# Patient Record
Sex: Male | Born: 1990 | Race: Black or African American | Hispanic: No | Marital: Single | Smoking: Never smoker
Health system: Southern US, Community
[De-identification: ages and names within clinical notes are randomized; demographics above are authoritative.]

## PROBLEM LIST (undated history)

## (undated) DIAGNOSIS — F209 Schizophrenia, unspecified: Secondary | ICD-10-CM

---

## 2014-08-16 ENCOUNTER — Emergency Department (HOSPITAL_COMMUNITY)
Admission: EM | Admit: 2014-08-16 | Discharge: 2014-08-16 | Disposition: A | Discharge status: Home / Self Care | Payer: Self-pay | Attending: Emergency Medicine | Admitting: Emergency Medicine

## 2014-08-16 ENCOUNTER — Encounter (HOSPITAL_COMMUNITY): Payer: Self-pay | Admitting: Emergency Medicine

## 2014-08-16 ENCOUNTER — Inpatient Hospital Stay: Payer: Self-pay | Admitting: Psychiatry

## 2014-08-16 ENCOUNTER — Emergency Department (HOSPITAL_COMMUNITY): Payer: Self-pay

## 2014-08-16 DIAGNOSIS — F22 Delusional disorders: Secondary | ICD-10-CM | POA: Insufficient documentation

## 2014-08-16 DIAGNOSIS — F29 Unspecified psychosis not due to a substance or known physiological condition: Secondary | ICD-10-CM | POA: Insufficient documentation

## 2014-08-16 LAB — CBC
HEMATOCRIT: 39.9 % (ref 39.0–52.0)
Hemoglobin: 13.9 g/dL (ref 13.0–17.0)
MCH: 31.7 pg (ref 26.0–34.0)
MCHC: 34.8 g/dL (ref 30.0–36.0)
MCV: 91.1 fL (ref 78.0–100.0)
PLATELETS: 165 10*3/uL (ref 150–400)
RBC: 4.38 MIL/uL (ref 4.22–5.81)
RDW: 11.5 % (ref 11.5–15.5)
WBC: 5.5 10*3/uL (ref 4.0–10.5)

## 2014-08-16 LAB — RAPID URINE DRUG SCREEN, HOSP PERFORMED
Amphetamines: NOT DETECTED
BARBITURATES: NOT DETECTED
Benzodiazepines: NOT DETECTED
Cocaine: NOT DETECTED
Opiates: NOT DETECTED
Tetrahydrocannabinol: NOT DETECTED

## 2014-08-16 LAB — COMPREHENSIVE METABOLIC PANEL
ALBUMIN: 4 g/dL (ref 3.5–5.2)
ALT: 11 U/L (ref 0–53)
ANION GAP: 11 (ref 5–15)
AST: 21 U/L (ref 0–37)
Alkaline Phosphatase: 61 U/L (ref 39–117)
BILIRUBIN TOTAL: 0.6 mg/dL (ref 0.3–1.2)
BUN: 16 mg/dL (ref 6–23)
CHLORIDE: 101 meq/L (ref 96–112)
CO2: 26 meq/L (ref 19–32)
Calcium: 9.1 mg/dL (ref 8.4–10.5)
Creatinine, Ser: 1.02 mg/dL (ref 0.50–1.35)
GFR calc Af Amer: >90 mL/min (ref >90)
Glucose, Bld: 88 mg/dL (ref 70–99)
Potassium: 3.9 mEq/L (ref 3.7–5.3)
Sodium: 138 mEq/L (ref 137–147)
Total Protein: 7.4 g/dL (ref 6.0–8.3)

## 2014-08-16 LAB — ETHANOL: Alcohol, Ethyl (B): <11 mg/dL (ref 0–11)

## 2014-08-16 LAB — SALICYLATE LEVEL

## 2014-08-16 LAB — ACETAMINOPHEN LEVEL: Acetaminophen (Tylenol), Serum: <15 ug/mL (ref 10–30)

## 2014-08-16 MED ORDER — ACETAMINOPHEN 325 MG PO TABS: 650.0000 mg | ORAL_TABLET | ORAL | Status: DC | PRN

## 2014-08-16 MED ORDER — ALUM & MAG HYDROXIDE-SIMETH 200-200-20 MG/5ML PO SUSP: 30.0000 mL | ORAL | Status: DC | PRN

## 2014-08-16 MED ORDER — LORAZEPAM 1 MG PO TABS: 1.0000 mg | ORAL_TABLET | Freq: Three times a day (TID) | ORAL | Status: DC | PRN

## 2014-08-16 MED ORDER — IBUPROFEN 200 MG PO TABS
600.0000 mg | ORAL_TABLET | Freq: Three times a day (TID) | ORAL | Status: DC | PRN
Start: 2014-08-16 — End: 2014-08-16

## 2014-08-16 MED ORDER — ONDANSETRON HCL 4 MG PO TABS: 4.0000 mg | ORAL_TABLET | Freq: Three times a day (TID) | ORAL | Status: DC | PRN

## 2014-08-16 NOTE — ED Notes (Signed)
Bed: [id number] Expected date:  Expected time:  Means of arrival:  Comments: [place]

## 2014-08-16 NOTE — ED Notes (Signed)
Pt states that he has the voices of famous artists in his head telling him to walk around and freestyle and they tell him that all girls want him; pt denies SI / HI; pt states "I am just chilling with these artists but my mom kicked me out"; pt states that his mother kicked him out last week; pt denies drug use but states that he uses marijuana

## 2014-08-16 NOTE — ED Notes (Signed)
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 Lavenia Atlasve been off my medications for a year now because I'm in the billionaires [employer] and they put a phone a head."  Denies SI/HI. States he will be compliant with any new medication regimine.

## 2014-08-16 NOTE — Consult Note (Signed)
[facility] Face-to-Face Psychiatry Consult   Reason for Consult:  bizarre behavior Referring Physician:  EDP  Glenn Bolton is an 23 y.o. male. Total Time spent with patient: 45 minutes  Assessment: AXIS I:  Psychotic Disorder NOS AXIS II:  Deferred AXIS III:  History reviewed. No pertinent past medical history. AXIS IV:  other psychosocial or environmental problems and problems related to social environment AXIS V:  21-30 behavior considerably influenced by delusions or hallucinations OR serious impairment in judgment, communication OR inability to function in almost all areas  Plan:  Recommend psychiatric Inpatient admission when medically cleared.  Subjective:    HPI:  Glenn Bolton is a 23 y.o. male patient who presented to [facility] with complaints of hearing voices.  Patient states that he does hear voices that tell him things "The voices are not new to me.  Always heard voices; they tell me things like; we're doing this/that; Do this/that.  I don't do it thought.  I'm just chilling out at [employer] and I called the cops  Cause people were talking through me; I just needed some where to rest.".  Patient states that he is homeless at this time.  Patient also states that his odd behavior could be a result of his marijuana use.  Patient denies suicidal/homicidal ideation and paranoia.  Patient states that it is difficult to explain how he is feeling "It is an acquired feeling all the time" Patient appears to be paranoid; looking around the room as if someone else may be in the room.   HPI Elements:   Location:  Psychotic disorder. Quality:  hearing voices. Severity:  odd behavior. Timing:  1 day. Review of Systems  Psychiatric/Behavioral: Positive for hallucinations and substance abuse. Negative for depression and suicidal ideas. The patient is not nervous/anxious and does not have insomnia.   All other systems reviewed and are negative.   Past Psychiatric History: History reviewed. No pertinent  past medical history.  reports that he has never smoked. He does not have any smokeless tobacco history on file. He reports that he drinks alcohol. He reports that he uses illicit drugs (Marijuana). No family history on file.         Allergies:  No Known Allergies  ACT Assessment Complete:  Yes:    Educational Status    Risk to Self: Risk to self with the past 6 months Is patient at risk for suicide?: No, but patient needs Medical Clearance Substance abuse history and/or treatment for substance abuse?: Yes  Risk to Others:    Abuse:    Prior Inpatient Therapy:    Prior Outpatient Therapy:    Additional Information:                    Objective: Blood pressure 134/71, pulse 67, temperature 97.6 F (36.4 C), temperature source Oral, resp. rate 16, height 6' (1.829 m), weight 74.844 kg (165 lb), SpO2 100.00%.Body mass index is 22.37 kg/(m^2). Results for orders placed during the hospital encounter of 08/16/14 (from the past 72 hour(s))  ACETAMINOPHEN LEVEL     Status: None   Collection Time    08/16/14  1:57 AM      Result Value Ref Range   Acetaminophen (Tylenol), Serum <15.0  10 - 30 ug/mL   Comment:            THERAPEUTIC CONCENTRATIONS VARY     SIGNIFICANTLY. A RANGE OF 10-30     ug/mL MAY BE AN EFFECTIVE       CONCENTRATION FOR MANY PATIENTS.     HOWEVER, SOME ARE BEST TREATED     AT CONCENTRATIONS OUTSIDE THIS     RANGE.     ACETAMINOPHEN CONCENTRATIONS     >150 ug/mL AT 4 HOURS AFTER     INGESTION AND >50 ug/mL AT 12     HOURS AFTER INGESTION ARE     OFTEN ASSOCIATED WITH TOXIC     REACTIONS.  CBC     Status: None   Collection Time    08/16/14  1:57 AM      Result Value Ref Range   WBC 5.5  4.0 - 10.5 K/uL   RBC 4.38  4.22 - 5.81 MIL/uL   Hemoglobin 13.9  13.0 - 17.0 g/dL   HCT 39.9  39.0 - 52.0 %   MCV 91.1  78.0 - 100.0 fL   MCH 31.7  26.0 - 34.0 pg   MCHC 34.8  30.0 - 36.0 g/dL   RDW 11.5  11.5 - 15.5 %   Platelets 165  150 - 400 K/uL   COMPREHENSIVE METABOLIC PANEL     Status: None   Collection Time    08/16/14  1:57 AM      Result Value Ref Range   Sodium 138  137 - 147 mEq/L   Potassium 3.9  3.7 - 5.3 mEq/L   Chloride 101  96 - 112 mEq/L   CO2 26  19 - 32 mEq/L   Glucose, Bld 88  70 - 99 mg/dL   BUN 16  6 - 23 mg/dL   Creatinine, Ser 1.02  0.50 - 1.35 mg/dL   Calcium 9.1  8.4 - 10.5 mg/dL   Total Protein 7.4  6.0 - 8.3 g/dL   Albumin 4.0  3.5 - 5.2 g/dL   AST 21  0 - 37 U/L   ALT 11  0 - 53 U/L   Alkaline Phosphatase 61  39 - 117 U/L   Total Bilirubin 0.6  0.3 - 1.2 mg/dL   GFR calc non Af Amer >90  >90 mL/min   GFR calc Af Amer >90  >90 mL/min   Comment: (NOTE)     The eGFR has been calculated using the CKD EPI equation.     This calculation has not been validated in all clinical situations.     eGFR's persistently <90 mL/min signify possible Chronic Kidney     Disease.   Anion gap 11  5 - 15  ETHANOL     Status: None   Collection Time    08/16/14  1:57 AM      Result Value Ref Range   Alcohol, Ethyl (B) <11  0 - 11 mg/dL   Comment:            LOWEST DETECTABLE LIMIT FOR     SERUM ALCOHOL IS 11 mg/dL     FOR MEDICAL PURPOSES ONLY  SALICYLATE LEVEL     Status: Abnormal   Collection Time    08/16/14  1:57 AM      Result Value Ref Range   Salicylate Lvl <6.7 (*) 2.8 - 20.0 mg/dL   Comment: CORRECTED RESULTS CALLED TO:     MARQUEZ,A RN AT 0625 10.13.15 BY TIBBITTS,K     CORRECTED ON 10/13 AT 6195: PREVIOUSLY REPORTED AS 0.8  URINE RAPID DRUG SCREEN (HOSP PERFORMED)     Status: None   Collection Time    08/16/14  3:00 AM      Result Value Ref Range  Opiates NONE DETECTED  NONE DETECTED   Cocaine NONE DETECTED  NONE DETECTED   Benzodiazepines NONE DETECTED  NONE DETECTED   Amphetamines NONE DETECTED  NONE DETECTED   Tetrahydrocannabinol NONE DETECTED  NONE DETECTED   Barbiturates NONE DETECTED  NONE DETECTED   Comment:            DRUG SCREEN FOR MEDICAL PURPOSES     ONLY.  IF CONFIRMATION IS  NEEDED     FOR ANY PURPOSE, NOTIFY LAB     WITHIN 5 DAYS.                LOWEST DETECTABLE LIMITS     FOR URINE DRUG SCREEN     Drug Class       Cutoff (ng/mL)     Amphetamine      1000     Barbiturate      200     Benzodiazepine   200     Tricyclics       300     Opiates          300     Cocaine          300     THC              50   Labs are reviewed see abnormal values above.  Medications reviewed and no changes made.    Current Facility-Administered Medications  Medication Dose Route Frequency Provider Last Rate Last Dose  . acetaminophen (TYLENOL) tablet 650 mg  650 mg Oral Q4H PRN Abigail Harris, PA-C      . alum & mag hydroxide-simeth (MAALOX/MYLANTA) 200-200-20 MG/5ML suspension 30 mL  30 mL Oral PRN Abigail Harris, PA-C      . ibuprofen (ADVIL,MOTRIN) tablet 600 mg  600 mg Oral Q8H PRN Abigail Harris, PA-C      . LORazepam (ATIVAN) tablet 1 mg  1 mg Oral Q8H PRN Abigail Harris, PA-C      . ondansetron (ZOFRAN) tablet 4 mg  4 mg Oral Q8H PRN Abigail Harris, PA-C       No current outpatient prescriptions on file.    Psychiatric Specialty Exam:     Blood pressure 134/71, pulse 67, temperature 97.6 F (36.4 C), temperature source Oral, resp. rate 16, height 6' (1.829 m), weight 74.844 kg (165 lb), SpO2 100.00%.Body mass index is 22.37 kg/(m^2).  General Appearance: Casual and Disheveled  Eye Contact::  Good  Speech:  Blocked and Normal Rate  Volume:  Decreased  Mood:  Depressed  Affect:  Congruent  Thought Process:  Circumstantial and Disorganized  Orientation:  Full (Time, Place, and Person)  Thought Content:  Hallucinations: Auditory Command:  "Do this/that, we're doing this/that"  Suicidal Thoughts:  No  Homicidal Thoughts:  No  Memory:  Immediate;   Fair Recent;   Fair Remote;   Fair  Judgement:  Impaired  Insight:  Lacking  Psychomotor Activity:  Normal  Concentration:  Fair  Recall:  Fair  Fund of Knowledge:Fair  Language: Fair  Akathisia:  No   Handed:  Right  AIMS (if indicated):     Assets:  Desire for Improvement  Sleep:      Musculoskeletal: Strength & Muscle Tone: within normal limits Gait & Station: normal Patient leans: N/A  Treatment Plan Summary: Daily contact with patient to assess and evaluate symptoms and progress in treatment Medication management Inpatient treatment recommended  , , FNP-BC 08/16/2014 3:10 PM 

## 2014-08-16 NOTE — ED Notes (Addendum)
Charted on wrong patient

## 2014-08-16 NOTE — ED Notes (Signed)
1 pt belonging bag in locker #28 

## 2014-08-16 NOTE — Progress Notes (Signed)
[facility] [employer] Stacy,  Provided pt with a list of primary care resources, highlighting [facility] and information on [facility] for outpatient mental health counseling.

## 2014-08-16 NOTE — ED Notes (Signed)
Pt transported to [facility] by [facility] transportation service for continuation of specialized care. He left in no acute distress. 

## 2014-08-16 NOTE — ED Provider Notes (Signed)
CSN: [id number]     Arrival date & time 08/16/14  0102 History   First MD Initiated Contact with Patient 08/16/14 [phone]     Chief Complaint  Patient presents with  . Hallucinations     (Consider location/radiation/quality/duration/timing/severity/associated sxs/prior Treatment) HPI  This is a 23 year old male who presents the emergency department hallucinations. Impression is that for the past 3 years he had daily conversations with famous artists. He lists Edwyna ReadyBeyonce, [address] and Ulice Brilliantrake. 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 The patient deny suicidal ideation, homicidal ideation. He denies a history of previous psychiatric hospitalizations. He denies any alcohol or drug use. He denies taking any medications. He states that his parents are "Tripping" and kicked him out of the house last week.   History reviewed. No pertinent past medical history. History reviewed. No pertinent past surgical history. No family history on file. History  Substance Use Topics  . Smoking status: Never Smoker   . Smokeless tobacco: Not on file  . Alcohol Use: Yes     Comment: socially    Review of Systems  Unable to perform ROS     Allergies  Review of patient's allergies indicates no known allergies.  Home Medications   Prior to Admission medications   Not on File   BP 110/71  Pulse 64  Temp(Src) 98 F (36.7 C) (Oral)  Resp 18  SpO2 99% Physical Exam  Nursing note and vitals reviewed. Constitutional: He appears well-developed and well-nourished. No distress.  HENT:  Head: Normocephalic and atraumatic.  Eyes: Conjunctivae are normal. No scleral icterus.  Neck: Normal range of motion. Neck supple.  Cardiovascular: Normal rate, regular rhythm and normal heart sounds.   Pulmonary/Chest: Effort normal and breath sounds normal. No respiratory distress.  Abdominal: Soft. There is no tenderness.  Musculoskeletal: He exhibits no edema.   Neurological: He is alert.  Skin: Skin is warm and dry. He is not diaphoretic.  Psychiatric: Thought content is delusional.    ED Course  Procedures (including critical care time) Labs Review Labs Reviewed  SALICYLATE LEVEL - Abnormal; Notable for the following:    Salicylate Lvl <2.0 (*)    All other components within normal limits  ACETAMINOPHEN LEVEL  CBC  COMPREHENSIVE METABOLIC PANEL  ETHANOL  URINE RAPID DRUG SCREEN (HOSP PERFORMED)    Imaging Review No results found.   EKG Interpretation None      MDM   Final diagnoses:  None    7:11 AM BP 110/71  Pulse 64  Temp(Src) 98 F (36.7 C) (Oral)  Resp 18  SpO2 99% Patient with delusional psychosis.  Awaiting TTS consult . Feel he may need inpatient psych work up.  Homeless.  Patient medically clear for psych eval.     Arthor CaptainAbigail Hudson Majkowski, PA-C 08/17/14 1224

## 2014-08-16 NOTE — ED Notes (Signed)
Call to [facility] and was told to call report after 7pm by staff.  Staff reported RN was in [employer] meeting.

## 2014-08-16 NOTE — BH Assessment (Signed)
Patient accepted to [facility] by Dr. Ardyth HarpsHernandez. The nursing report # is [phone]. Pt will be transported via [facility].

## 2014-08-16 NOTE — ED Notes (Signed)
MD at bedside. 

## 2014-08-16 NOTE — ED Notes (Signed)
1 pt belonging bag in locker #32 

## 2014-08-17 NOTE — Consult Note (Signed)
Face to face evaluation and I agree with this note 

## 2014-08-17 NOTE — ED Provider Notes (Signed)
Medical screening examination/treatment/procedure(s) were performed by non-physician practitioner and as supervising physician I was immediately available for consultation/collaboration.   EKG Interpretation None        Shahzain Kiester, MD 08/17/14 1415 

## 2014-08-22 LAB — LIPID PANEL
Cholesterol: 118 mg/dL (ref 0–200)
HDL Cholesterol: 52 mg/dL (ref 40–60)
LDL CHOLESTEROL, CALC: 57 mg/dL (ref 0–100)
TRIGLYCERIDES: 46 mg/dL (ref 0–200)
VLDL CHOLESTEROL, CALC: 9 mg/dL (ref 5–40)

## 2014-08-22 LAB — HEMOGLOBIN A1C: Hemoglobin A1C: 5.4 % (ref 4.2–6.3)

## 2014-08-22 LAB — VALPROIC ACID LEVEL: Valproic Acid: 102 ug/mL — ABNORMAL HIGH

## 2014-08-25 LAB — AMMONIA: AMMONIA, PLASMA: 161 umol/L — AB (ref 11–32)

## 2014-08-25 LAB — VALPROIC ACID LEVEL: VALPROIC ACID: 100 ug/mL

## 2014-08-26 LAB — AMMONIA: AMMONIA, PLASMA: 43 umol/L — AB (ref 11–32)

## 2014-08-26 LAB — VALPROIC ACID LEVEL: VALPROIC ACID: 57 ug/mL

## 2015-02-25 NOTE — H&P (Signed)
PATIENT NAME:  Glenn Bolton, Glenn Bolton MR#:  [id number] DATE OF BIRTH:  [birth year]  DATE OF ASSESSMENT: 08/17/2014   [facility]   ATTENDING PHYSICIAN: Jolanta B. Jennet MaduroPucilowska, MD  IDENTIFYING DATA: Glenn Bolton is a 24 year old male with history of psychotic disorder.   CHIEF COMPLAINT: "I came here because I had no place to go."   HISTORY OF PRESENT ILLNESS: Glenn Bolton reports a long history of mental illness, even though he is only 24 years old. [place] [facility] [place] Both times he was placed on medications, the names he does not remember.  He had stayed on his medicines up until 1 year ago.  At [place] point [place] stay with his mother, but apparently following discontinuation of medicines  the patient became acutely delusional. He believes that he has a record contract, talks to [place]once and Jay Z all the time and soon will be very, very rich.  This is about one thing that he talks about Liane ComberJay Z and [place]once and his record contract. I imagine that his mother got tired of this type of conversation. She reportedly kicked him out. We do not have any contact with the mother and the patient says "I have no mother"; although technically he does.  He denies any symptoms of depression, anxiety, or psychosis, but [facility] for strange behavior, talking to himself, hallucinations, and paranoid delusions. He denies any use of alcohol or illicit substance. Indeed, he is negative for substances on urine toxicology screen.   PAST PSYCHIATRIC HISTORY: As above, the patient is not really forthcoming with information and rather repetitive with his talk about record, Liane ComberJay Z and [place]once. He has 2 prior hospitalizations.  He tells me that he did okay on medication.  At the moment he has zero insight, absolutely denies any symptoms, and does not believe that he needs any medications  whatsoever. He denies ever attempting suicide. He does not have a provider in our area.    FAMILY PSYCHIATRIC HISTORY: Unknown.   PAST MEDICAL HISTORY: Denies.   ALLERGIES: No known drug allergies.   MEDICATIONS ON ADMISSION: None.   SOCIAL HISTORY: Unable to obtain. The patient is unwilling to provide any information. Apparently, his mother lives in [place], [place] in [place] area, but the patient listed his name only on admission.   REVIEW OF SYSTEMS:  CONSTITUTIONAL: No fevers or chills. Positive for weight changes. He is rather skinny.  EYES: No double or blurred vision.  ENT: No hearing loss.  RESPIRATORY: No shortness of breath or cough.  CARDIOVASCULAR: No chest pain or orthopnea.  GASTROINTESTINAL: No abdominal pain, nausea, vomiting, or diarrhea.  GENITOURINARY: No incontinence or frequency.  ENDOCRINE: No heat or cold intolerance.  LYMPHATIC: No anemia or easy bruising.  INTEGUMENTARY: No acne or rash.  MUSCULOSKELETAL: No muscle or joint pain.  NEUROLOGIC: No tingling or weakness.  PSYCHIATRIC: See history of present illness for details.   PHYSICAL EXAMINATION: VITAL SIGNS: Blood pressure 117/71, pulse 96, respirations 16, temperature 98.2.  GENERAL: This is a very slender, unkempt young male in no acute distress.  HEENT: The pupils are equal, round, and reactive to light. Sclerae anicteric.  NECK: Supple. No thyromegaly.  LUNGS: Clear to auscultation. No dullness to percussion.  HEART: Regular rhythm and rate. No murmurs, rubs, or gallops.  ABDOMEN: Soft, nontender, nondistended. Positive bowel sounds.  MUSCULOSKELETAL: Normal muscle strength  in all extremities.  SKIN: No rashes or bruises.  LYMPHATIC: No cervical adenopathy.  NEUROLOGIC: Cranial nerves II through XII are intact.   LABORATORY DATA: They were all performed at [facility]. They were all within normal limits. Urine toxicology screen negative for substances. Head CT scan was  negative.   MENTAL STATUS EXAMINATION ON ADMISSION: The patient is alert and oriented to person, somewhat to time, and situation. He tells me that he had nowhere to go, so he decided to come to the hospital to find a place to sleep. He is pleasant, polite, and cooperative, but easily gets irritated when told that he would not be leaving the hospital immediately. He maintains good eye contact. There is some psychomotor agitation. His grooming is borderline. He wears hospital scrubs. His speech is pressured and loud at times and thought process is disorganized and influenced by paranoid delusions.  He denies auditory or visual hallucinations. His cognition is impaired. He seems of average intelligence and fund of knowledge. His insight and judgment are dismal.   SUICIDE RISK ASSESSMENT ON ADMISSION: This is a patient with history of psychotic disorder, most likely bipolar, who comes to the hospital floridly psychotic, delusional, unable to care for himself, in need of treatment and followup.   This is a patient with history of severe psychosis, admitted floridly psychotic in the context of medication noncompliance with very little social support.   INITIAL DIAGNOSES:  AXIS I: Bipolar I, mania with psychosis.  AXIS II: Deferred.  AXIS III: Deferred.   PLAN: The patient was admitted to [facility] for safety, stabilization, and medication management. He was initially placed on suicide precautions and was closely monitored for any unsafe behaviors. He underwent full psychiatric and risk assessment. He received pharmacotherapy, individual and group psychotherapy, substance abuse counseling, and support from therapeutic milieu.  1.  Psychosis. We will start Invega tablets and transition to [place] for psychosis and Depakote for mood stabilization.  2.  Social. We will try to obtain collateral data.  3.  Disposition to be  established.   ____________________________ Ellin Goodie. Jennet Maduro, MD jbp:LT D: 08/17/2014 16:09:21 ET T: 08/17/2014 16:49:49 ET JOB#: [id number]  cc: Jolanta B. Jennet Maduro, MD, <Dictator> Shari Prows MD ELECTRONICALLY SIGNED 09/12/2014 6:22

## 2015-03-04 ENCOUNTER — Emergency Department (HOSPITAL_COMMUNITY)
Admission: EM | Admit: 2015-03-04 | Discharge: 2015-03-05 | Disposition: A | Discharge status: Home / Self Care | Payer: Medicaid Other | Attending: Emergency Medicine | Admitting: Emergency Medicine

## 2015-03-04 ENCOUNTER — Encounter (HOSPITAL_COMMUNITY): Payer: Self-pay | Admitting: Emergency Medicine

## 2015-03-04 DIAGNOSIS — F22 Delusional disorders: Secondary | ICD-10-CM | POA: Diagnosis present

## 2015-03-04 DIAGNOSIS — F29 Unspecified psychosis not due to a substance or known physiological condition: Secondary | ICD-10-CM | POA: Diagnosis not present

## 2015-03-04 DIAGNOSIS — R440 Auditory hallucinations: Secondary | ICD-10-CM

## 2015-03-04 HISTORY — DX: Schizophrenia, unspecified: F20.9

## 2015-03-04 LAB — CBC
HCT: 40.2 % (ref 39.0–52.0)
HEMOGLOBIN: 14.4 g/dL (ref 13.0–17.0)
MCH: 32.6 pg (ref 26.0–34.0)
MCHC: 35.8 g/dL (ref 30.0–36.0)
MCV: 91 fL (ref 78.0–100.0)
Platelets: 162 10*3/uL (ref 150–400)
RBC: 4.42 MIL/uL (ref 4.22–5.81)
RDW: 11.7 % (ref 11.5–15.5)
WBC: 5.6 10*3/uL (ref 4.0–10.5)

## 2015-03-04 LAB — COMPREHENSIVE METABOLIC PANEL
ALBUMIN: 4.4 g/dL (ref 3.5–5.2)
ALT: 9 U/L (ref 0–53)
ANION GAP: 3 — AB (ref 5–15)
AST: 17 U/L (ref 0–37)
Alkaline Phosphatase: 56 U/L (ref 39–117)
BUN: 16 mg/dL (ref 6–23)
CALCIUM: 9.1 mg/dL (ref 8.4–10.5)
CO2: 29 mmol/L (ref 19–32)
CREATININE: 1.1 mg/dL (ref 0.50–1.35)
Chloride: 107 mmol/L (ref 96–112)
GFR calc Af Amer: >90 mL/min (ref >90)
GFR calc non Af Amer: >90 mL/min (ref >90)
Glucose, Bld: 91 mg/dL (ref 70–99)
Potassium: 3.7 mmol/L (ref 3.5–5.1)
Sodium: 139 mmol/L (ref 135–145)
Total Bilirubin: 1.2 mg/dL (ref 0.3–1.2)
Total Protein: 7.6 g/dL (ref 6.0–8.3)

## 2015-03-04 LAB — RAPID URINE DRUG SCREEN, HOSP PERFORMED
Amphetamines: NOT DETECTED
BARBITURATES: NOT DETECTED
Benzodiazepines: NOT DETECTED
Cocaine: NOT DETECTED
OPIATES: NOT DETECTED
Tetrahydrocannabinol: NOT DETECTED

## 2015-03-04 LAB — ACETAMINOPHEN LEVEL

## 2015-03-04 LAB — ETHANOL

## 2015-03-04 LAB — SALICYLATE LEVEL: Salicylate Lvl: <4 mg/dL (ref 2.8–20.0)

## 2015-03-04 MED ORDER — ACETAMINOPHEN 325 MG PO TABS: 650.0000 mg | ORAL_TABLET | ORAL | Status: DC | PRN

## 2015-03-04 MED ORDER — LORAZEPAM 1 MG PO TABS: 1.0000 mg | ORAL_TABLET | Freq: Three times a day (TID) | ORAL | Status: DC | PRN

## 2015-03-04 NOTE — ED Notes (Signed)
Pt arrived to the ED with a complaint of schizophrenic activity.  Pt states he has not been taking his seroquel that he is prescribed and has been talking non stop for days.  Pt states he has been talking to several rap celebrities who have signed him to a record label. Pt states that he has been in and out of "psych hospitals"

## 2015-03-04 NOTE — ED Notes (Signed)
TTS on going at conference.

## 2015-03-04 NOTE — ED Provider Notes (Signed)
CSN: [id number]     Arrival date & time 03/04/15  2106 History   First MD Initiated Contact with Patient 03/04/15 2203     Chief Complaint  Patient presents with  . Paranoid   HPI Patient presents to the emergency room with complaints of  grandiose thoughts associated with his schizophrenia. Patient states he has a history of schizophrenia. He has been out of his medications for the last several days. He has been having auditory hallucinations. Patient states he hears voices of several Rap celebrities telling him that he is getting signed to a record label and a contract. Patient recognizes that these voices aren't real. He denies any suicidal or homicidal ideation. He has not been sleeping well because the voices are nonstop. He denies any drug use or alcohol use. Past Medical History  Diagnosis Date  . Schizophrenia    No past surgical history on file. No family history on file. History  Substance Use Topics  . Smoking status: Never Smoker   . Smokeless tobacco: Not on file  . Alcohol Use: Yes     Comment: socially    Review of Systems  All other systems reviewed and are negative.     Allergies  Review of patient's allergies indicates no known allergies.  Home Medications   Prior to Admission medications   Not on File   BP 130/72 mmHg  Pulse 63  Temp(Src) 97.8 F (36.6 C) (Oral)  Resp 18  SpO2 100% Physical Exam  Constitutional: He appears well-developed and well-nourished. No distress.  HENT:  Head: Normocephalic and atraumatic.  Right Ear: External ear normal.  Left Ear: External ear normal.  Eyes: Right eye exhibits no discharge. Left eye exhibits no discharge. No scleral icterus.   Mild conjunctival injection of the left eye  Neck: Neck supple. No tracheal deviation present.  Cardiovascular: Normal rate, regular rhythm and intact distal pulses.   Pulmonary/Chest: Effort normal and breath sounds normal. No stridor. No respiratory distress. He has no wheezes.  He has no rales.  Abdominal: Soft. Bowel sounds are normal. He exhibits no distension. There is no tenderness. There is no rebound and no guarding.  Musculoskeletal: He exhibits no edema or tenderness.  Neurological: He is alert. He has normal strength. No cranial nerve deficit (no facial droop, extraocular movements intact, no slurred speech) or sensory deficit. He exhibits normal muscle tone. He displays no seizure activity. Coordination normal.  Skin: Skin is warm and dry. No rash noted.  Psychiatric: He has a normal mood and affect. His speech is not rapid and/or pressured, not delayed and not tangential. He is actively hallucinating. He does not exhibit a depressed mood. He expresses no homicidal and no suicidal ideation.  Nursing note and vitals reviewed.   ED Course  Procedures (including critical care time) Labs Review Labs Reviewed  ACETAMINOPHEN LEVEL - Abnormal; Notable for the following:    Acetaminophen (Tylenol), Serum <10.0 (*)    All other components within normal limits  COMPREHENSIVE METABOLIC PANEL - Abnormal; Notable for the following:    Anion gap 3 (*)    All other components within normal limits  CBC  ETHANOL  SALICYLATE LEVEL  URINE RAPID DRUG SCREEN (HOSP PERFORMED)    MDM   Final diagnoses:  Auditory hallucinations  Psychosis, unspecified psychosis type    Pt is complaining of auditory hallucinations, history of schizophrenia.  Plan on psych consult.    Linwood DibblesJon Khallid Pasillas, MD 03/04/15 [phone]

## 2015-03-04 NOTE — BH Assessment (Signed)
Tele Assessment Note   Glenn Bolton is an 24 y.o. male  presenting to [facility] reporting auditory hallucinations. Pt stated "I have schizophrenia and people are telling me that I am singed to a record label". "I can hear Ulice Brilliant and Lil'Wayne telling me that I am a rapper". "They are saying that I am signed to a record label and that's what keeps me motivated". "It is hard for me to have a conversation because I can hear voices 24/7".  Pt denies SI and HI at this time. Pt is reporting auditory hallucinations. Pt reported that he has a history of schizophrenia but is currently not receiving any mental health treatment. Pt reported that he was prescribed Invega, Zyprexa and Seroquel; however he never fill the prescription for the Invega. Pt also reported that he stopped taking his Seroquel approximately 5 days ago because he forgets to take it. Pt reported that he has been hospitalized several times in the past with his most recent hospitalization being in October at [facility]. PT is reporting some depressive symptoms and reported that his appetite is poor. Pt did not report any issues with his sleep but stated "I can feel what is happening in my dreams".  "It's weird". Pt did not report any pending criminal charges or upcoming court dates. Pt denied having access to weapons or firearms at this time. Pt did not report any alcohol or illicit substance abuse at this time. Pt denies any physical, sexual or emotional abuse.  Pt is alert and oriented x3. Pt is calm and cooperative at this time. Pt maintained good eye contact throughout this assessment. PT mood is euthymic.  Inpatient treatment is recommended.   Axis I: Schizophrenia   Past Medical History:  Past Medical History  Diagnosis Date  . Schizophrenia     No past surgical history on file.  Family History: No family history on file.  Social History:  reports that he has never smoked. He does not have any smokeless tobacco history on file. He  reports that he drinks alcohol. He reports that he uses illicit drugs (Marijuana).  Additional Social History:  Alcohol / Drug Use History of alcohol / drug use?: No history of alcohol / drug abuse  CIWA: CIWA-Ar BP: 130/72 mmHg Pulse Rate: 63 COWS:    PATIENT STRENGTHS: (choose at least two) Average or above average intelligence Communication skills  Allergies: No Known Allergies  Home Medications:  (Not in a hospital admission)  OB/GYN Status:  No LMP for male patient.  General Assessment Data [facility] of [facility]: [facility] TTS Assessment: In system Is this a Tele or Face-to-Face Assessment?: Face-to-Face Is this an Initial Assessment or a Re-assessment for this encounter?: Initial Assessment Living Arrangements: Parent Can pt return to current living arrangement?: Yes Admission Status: Voluntary Is patient capable of signing voluntary admission?: Yes Transfer from: Home Referral Source: Self/Family/Friend     Crisis Care Plan Living Arrangements: Parent Name of Psychiatrist: No provider reported at this time. Name of Therapist: No provider reported at this time   Education Status Is patient currently in school?: No Current Grade: NA Highest grade of school patient has completed: "Some college" Name of school: NA Contact person: NA  Risk to self with the past 6 months Suicidal Ideation: No Has patient been a risk to self within the past 6 months prior to admission? : No Suicidal Intent: No Has patient had any suicidal intent within the past 6 months prior to admission? : No Is patient  at risk for suicide?: No Suicidal Plan?: No Has patient had any suicidal plan within the past 6 months prior to admission? : No Access to Means: No What has been your use of drugs/alcohol within the last 12 months?: No alcohol or drug use reported.  Previous Attempts/Gestures: No How many times?: 0 Other Self Harm Risks: No self harm risk identified at this time.  Triggers  for Past Attempts: None known Intentional Self Injurious Behavior: None Family Suicide History: No Recent stressful life event(s): Other (Comment), Financial Problems (Hallucinations ) Persecutory voices/beliefs?: Yes Depression: No Depression Symptoms: Isolating, Fatigue, Loss of interest in usual pleasures, Feeling worthless/self pity Substance abuse history and/or treatment for substance abuse?: No Suicide prevention information given to non-admitted patients: Not applicable  Risk to Others within the past 6 months Homicidal Ideation: No Does patient have any lifetime risk of violence toward others beyond the six months prior to admission? : No Thoughts of Harm to Others: No Current Homicidal Intent: No Current Homicidal Plan: No Access to Homicidal Means: No Identified Victim: NA History of harm to others?: No Assessment of Violence: None Noted Violent Behavior Description: No violent behaviors observed. Pt is calm and cooperative at this time.  Does patient have access to weapons?: No Criminal Charges Pending?: No Does patient have a court date: No Is patient on probation?: Unknown  Psychosis Hallucinations: Auditory Delusions: Grandiose  Mental Status Report Appearance/Hygiene: In scrubs Eye Contact: Good Motor Activity: Freedom of movement Speech: Logical/coherent Level of Consciousness: Quiet/awake Mood: Euthymic Affect: Blunted Anxiety Level: None Thought Processes: Relevant, Coherent Judgement: Unimpaired Orientation: Person, Place, Time, Situation Obsessive Compulsive Thoughts/Behaviors: None  Cognitive Functioning Concentration: Fair Memory: Recent Intact IQ: Average Insight: Fair Impulse Control: Good Appetite: Poor Weight Loss: 15 (2 months ) Weight Gain: 0 Sleep: No Change Total Hours of Sleep: 8 Vegetative Symptoms: Staying in bed, Decreased grooming  ADLScreening [facility] Assessment Services) Patient's cognitive ability adequate to safely  complete daily activities?: Yes Patient able to express need for assistance with ADLs?: Yes Independently performs ADLs?: Yes (appropriate for developmental age)  Prior Inpatient Therapy Prior Inpatient Therapy: Yes Prior Therapy Dates: 2012-2015 [facility], [facility], [facility]  Reason for Treatment: Schizophrenia   Prior Outpatient Therapy Prior Outpatient Therapy: Yes Prior Therapy Dates: Past history reported dates unknown Prior Therapy Facilty/Provider(s): Pt is unable to recall the name of providers at this time.  Reason for Treatment: Schizophrenia  Does patient have an ACCT team?: No Does patient have Intensive In-House Services?  : No Does patient have [facility] services? : No Does patient have P4CC services?: No  ADL Screening (condition at time of admission) Patient's cognitive ability adequate to safely complete daily activities?: Yes Is the patient deaf or have difficulty hearing?: No Does the patient have difficulty seeing, even when wearing glasses/contacts?: No Does the patient have difficulty concentrating, remembering, or making decisions?: No Patient able to express need for assistance with ADLs?: Yes Does the patient have difficulty dressing or bathing?: No Independently performs ADLs?: Yes (appropriate for developmental age) Does the patient have difficulty walking or climbing stairs?: No       Abuse/Neglect Assessment (Assessment to be complete while patient is alone) Physical Abuse: Denies Verbal Abuse: Denies Sexual Abuse: Denies Exploitation of patient/patient's resources: Denies Self-Neglect: Denies     Merchant navy officerAdvance Directives (For Healthcare) Does patient have an advance directive?: No Would patient like information on creating an advanced directive?: No - patient declined information  Additional Information 1:1 In Past 12 Months?: No CIRT Risk: No Elopement Risk: No Does patient have  medical clearance?: Yes     Disposition:  Disposition Initial Assessment Completed for this Encounter: Yes Disposition of Patient: Inpatient treatment program Type of inpatient treatment program: Adult  Retina Bernardy S 03/04/2015 11:35 PM

## 2015-03-04 NOTE — BH Assessment (Signed)
Assessment completed. Consulted Ijeoma Nwaeze, NP who agrees that pt meets inpHulan Fessatient criteria. Dr. Lynelle DoctorKnapp has been informed of the recommendation. [facility]. TTS will contact other facilities for placement.

## 2015-03-05 NOTE — ED Notes (Signed)
[facility] here to transport 

## 2015-03-05 NOTE — Progress Notes (Signed)
Pt has been [facility] by Dr. Robet Leuhotakura to [facility] unit. Dr. Jannifer FranklinAkintayo initiating IVC, sheriff will need to be called for transport after IVC. Call report to [phone].   York SpanielAlexandra Dhyana Bastone [facility] InstituteCSWA Clinical Social Worker [facility] phone: [phone]

## 2015-03-05 NOTE — ED Notes (Signed)
Per Kennith Centerracey in intake at [facility]-pt is on the wait list

## 2015-03-05 NOTE — ED Notes (Signed)
Patient cooperative, appears mildly anxious. Denies SI, HI. Reports hearing voices and talking to himself "a lot". Patient denies feelings of anxiety and depression. States his appetite has been decreased in recent weeks. Reports weight loss of approximately 15 lbs over 2 months.  Encouragement offered. Environment adjusted. Snack provided.  Q 15 safety checks in place.

## 2015-03-05 NOTE — ED Notes (Addendum)
Pt ambulatory w/o difficulty to [facility] w/ [facility].  Transfer report, MAR report, face sheet, EMTELA, and belongings given to driver.

## 2015-03-23 ENCOUNTER — Emergency Department (HOSPITAL_COMMUNITY)
Admission: EM | Admit: 2015-03-23 | Discharge: 2015-03-23 | Disposition: A | Discharge status: Other Acute Inpatient Hospital | Payer: Medicaid Other | Attending: Emergency Medicine | Admitting: Emergency Medicine

## 2015-03-23 ENCOUNTER — Encounter (HOSPITAL_COMMUNITY): Payer: Self-pay | Admitting: Emergency Medicine

## 2015-03-23 ENCOUNTER — Encounter (HOSPITAL_COMMUNITY): Payer: Self-pay | Admitting: *Deleted

## 2015-03-23 ENCOUNTER — Inpatient Hospital Stay (HOSPITAL_COMMUNITY)
Admission: AD | Admit: 2015-03-23 | Discharge: 2015-03-31 | DRG: 885 | Disposition: A | Discharge status: Home / Self Care | Payer: Federal, State, Local not specified - Other | Source: Intra-hospital | Attending: Psychiatry | Admitting: Psychiatry

## 2015-03-23 DIAGNOSIS — F200 Paranoid schizophrenia: Secondary | ICD-10-CM | POA: Diagnosis not present

## 2015-03-23 DIAGNOSIS — F209 Schizophrenia, unspecified: Secondary | ICD-10-CM | POA: Diagnosis present

## 2015-03-23 DIAGNOSIS — F201 Disorganized schizophrenia: Secondary | ICD-10-CM | POA: Insufficient documentation

## 2015-03-23 DIAGNOSIS — Z79899 Other long term (current) drug therapy: Secondary | ICD-10-CM | POA: Diagnosis not present

## 2015-03-23 DIAGNOSIS — F22 Delusional disorders: Secondary | ICD-10-CM | POA: Diagnosis not present

## 2015-03-23 DIAGNOSIS — R440 Auditory hallucinations: Secondary | ICD-10-CM | POA: Diagnosis present

## 2015-03-23 DIAGNOSIS — F17200 Nicotine dependence, unspecified, uncomplicated: Secondary | ICD-10-CM | POA: Diagnosis present

## 2015-03-23 DIAGNOSIS — F419 Anxiety disorder, unspecified: Secondary | ICD-10-CM | POA: Insufficient documentation

## 2015-03-23 DIAGNOSIS — R443 Hallucinations, unspecified: Secondary | ICD-10-CM

## 2015-03-23 DIAGNOSIS — Z720 Tobacco use: Secondary | ICD-10-CM | POA: Diagnosis not present

## 2015-03-23 LAB — COMPREHENSIVE METABOLIC PANEL
ALK PHOS: 54 U/L (ref 38–126)
ALT: 9 U/L — AB (ref 17–63)
AST: 20 U/L (ref 15–41)
Albumin: 4.5 g/dL (ref 3.5–5.0)
Anion gap: 9 (ref 5–15)
BUN: 12 mg/dL (ref 6–20)
CHLORIDE: 104 mmol/L (ref 101–111)
CO2: 26 mmol/L (ref 22–32)
Calcium: 9.7 mg/dL (ref 8.9–10.3)
Creatinine, Ser: 1.09 mg/dL (ref 0.61–1.24)
GFR calc non Af Amer: >60 mL/min (ref >60)
GLUCOSE: 88 mg/dL (ref 65–99)
Potassium: 4.1 mmol/L (ref 3.5–5.1)
Sodium: 139 mmol/L (ref 135–145)
TOTAL PROTEIN: 8.1 g/dL (ref 6.5–8.1)
Total Bilirubin: 0.9 mg/dL (ref 0.3–1.2)

## 2015-03-23 LAB — RAPID URINE DRUG SCREEN, HOSP PERFORMED
Amphetamines: NOT DETECTED
BARBITURATES: NOT DETECTED
Benzodiazepines: NOT DETECTED
Cocaine: NOT DETECTED
OPIATES: NOT DETECTED
TETRAHYDROCANNABINOL: NOT DETECTED

## 2015-03-23 LAB — CBC
HEMATOCRIT: 42.2 % (ref 39.0–52.0)
HEMOGLOBIN: 14.8 g/dL (ref 13.0–17.0)
MCH: 32 pg (ref 26.0–34.0)
MCHC: 35.1 g/dL (ref 30.0–36.0)
MCV: 91.1 fL (ref 78.0–100.0)
Platelets: 187 10*3/uL (ref 150–400)
RBC: 4.63 MIL/uL (ref 4.22–5.81)
RDW: 11.9 % (ref 11.5–15.5)
WBC: 6.6 10*3/uL (ref 4.0–10.5)

## 2015-03-23 LAB — ETHANOL: Alcohol, Ethyl (B): <5 mg/dL (ref <5)

## 2015-03-23 LAB — SALICYLATE LEVEL

## 2015-03-23 LAB — ACETAMINOPHEN LEVEL

## 2015-03-23 MED ORDER — HYDROXYZINE HCL 25 MG PO TABS
25.0000 mg | ORAL_TABLET | Freq: Three times a day (TID) | ORAL | Status: DC | PRN
  Administered 2015-03-25: 25 mg via ORAL
  Filled 2015-03-23: qty 1
  Filled 2015-03-23: qty 20

## 2015-03-23 MED ORDER — ALUM & MAG HYDROXIDE-SIMETH 200-200-20 MG/5ML PO SUSP: 30.0000 mL | ORAL | Status: DC | PRN

## 2015-03-23 MED ORDER — MAGNESIUM HYDROXIDE 400 MG/5ML PO SUSP: 30.0000 mL | Freq: Every day | ORAL | Status: DC | PRN

## 2015-03-23 MED ORDER — TRAZODONE HCL 50 MG PO TABS
50.0000 mg | ORAL_TABLET | Freq: Every evening | ORAL | Status: DC | PRN
  Administered 2015-03-24 – 2015-03-29 (×3): 50 mg via ORAL
  Filled 2015-03-23 (×2): qty 1
  Filled 2015-03-23: qty 28
  Filled 2015-03-23: qty 1

## 2015-03-23 MED ORDER — NICOTINE POLACRILEX 2 MG MT GUM
2.0000 mg | CHEWING_GUM | OROMUCOSAL | Status: DC | PRN
Start: 2015-03-23 — End: 2015-03-31
  Administered 2015-03-24 – 2015-03-31 (×13): 2 mg via ORAL
  Filled 2015-03-23 (×10): qty 1

## 2015-03-23 MED ORDER — ENSURE ENLIVE PO LIQD: 237.0000 mL | Freq: Two times a day (BID) | ORAL | Status: DC

## 2015-03-23 MED ORDER — ACETAMINOPHEN 325 MG PO TABS: 650.0000 mg | ORAL_TABLET | Freq: Four times a day (QID) | ORAL | Status: DC | PRN

## 2015-03-23 NOTE — BH Assessment (Signed)
[facility] by Nanine MeansJamison Lord, NP. Patient assigned to 500-1. Nursing report [id number]. Support paperwork completed.

## 2015-03-23 NOTE — Progress Notes (Signed)
24 yr old male [place] pt talking to musical stars after he says he signed a deal with them. CM spoke with TCU RN about pt prior to meeting him.  Pt initially noted staring at the wall and talking to himself.  Pt stopped talking to himself when CM entered room Pt pleasant and respectful Pt confirmed with CM he has not seen a pcp and does not have one.  Pt states his mother may not be able to be reached because of her telephone "is off"  Cm discussed the [facility] that includes labs, imaging and evaluation.  CM spoke with pt who [place]. CM discussed and provided written information for self pay pcps vs EDPs, importance of pcp for f/u care, www.needymeds.org, www.goodrx.com, discounted pharmacies and other [place] resources such as [facility] , P4CC, affordable care act,  financial assistance, self pay dental services, [place] med assist, DSS and  health department  Reviewed resources for [place] self pay pcps like [facility] [place], family medicine at [address], [facility] family practice, general medical clinics, [facility] urgent care plus others, medication resources, [facility] out patient pharmacies and housing Pt voiced understanding and appreciation of resources provided   Provided [facility] contact information

## 2015-03-23 NOTE — Progress Notes (Signed)
Psychoeducational Group Note  Date:  03/23/2015 Time:  2148  Group Topic/Focus:  Wrap-Up Group:   The focus of this group is to help patients review their daily goal of treatment and discuss progress on daily workbooks.  Participation Level: Did Not Attend  Participation Quality:  Not Applicable  Affect:  Not Applicable  Cognitive:  Not Applicable  Insight:  Not Applicable  Engagement in Group: Not Applicable  Additional Comments:  The patient did not attend group this evening since he remained in his room.   Hazle CocaGOODMAN, Rosi Secrist S 03/23/2015, 9:47 PM

## 2015-03-23 NOTE — BH Assessment (Signed)
Assessment Note  Glenn Bolton is an 24 y.o. male presenting to [facility] reporting auditory hallucinations. Pt stated "I have schizophrenia and people are telling me that I am signed to a record label in addition to Hooper, Chad, and Bledsoe Z". "I can hear Ulice Brilliant and Lil'Wayne telling me that I am a rapper". "They are saying that I am signed to a record label and that's what keeps me motivated". Patient also hears voices telling him, "People never die" and "Go to the mall to get whatever you want because it's all free". Patient stating "It is hard for me to have a conversation because I can hear voices 24/7".   Pt denies SI and HI at this time. Pt is reporting auditory hallucinations. Pt reported that he has a history of schizophrenia with a follow up appointment scheduled at [facility] 04/22/2015. Patient recently released from [facility] 1-2 weeks ago. Sts that at discharge form [facility] he was prescribed Invega (Injection). Patient in the past tried Zyprexa and Seroquel but sts they didn't work for him.   Pt reported that he has been hospitalized several times in the past with his most recent hospitalization being [facility] 03/2015 and October at [facility]. Pt is reporting some depressive symptoms and reported that his appetite is poor. He reports loosing 15 pounds in the past month but back gaining 5.  Pt did not report any issues with his sleep but stated "I can feel what is happening in my dreams" and "It's weird". Pt did not report any pending criminal charges or upcoming court dates. Pt denied having access to weapons or firearms at this time.   Pt did not report any alcohol or illicit substance abuse at this time. Pt denies any physical, sexual or emotional abuse.   Pt is alert and oriented x3. Pt is calm and cooperative at this time. Pt maintained good eye contact throughout this assessment. PT mood is euthymic.   Inpatient treatment is recommended by Nanine Means, DNP   Axis I:  Schizophrenia  Axis II: Deferred Axis III:  Past Medical History  Diagnosis Date  . Schizophrenia    Axis IV: other psychosocial or environmental problems, problems related to social environment, problems with access to health care services and problems with primary support group Axis V: 31-40 impairment in reality testing  Past Medical History:  Past Medical History  Diagnosis Date  . Schizophrenia     History reviewed. No pertinent past surgical history.  Family History: History reviewed. No pertinent family history.  Social History:  reports that he has never smoked. He does not have any smokeless tobacco history on file. He reports that he drinks alcohol. He reports that he uses illicit drugs (Marijuana).  Additional Social History:  Alcohol / Drug Use Pain Medications: SEE MAR Prescriptions: SEE MAR (Invega Injection) Over the Counter: SEE MAR History of alcohol / drug use?: No history of alcohol / drug abuse  CIWA: CIWA-Ar BP: (!) 107/50 mmHg Pulse Rate: 71 COWS:    Allergies: No Known Allergies  Home Medications:  (Not in a hospital admission)  OB/GYN Status:  No LMP for male patient.  General Assessment Data [facility] TTS Assessment: In system Is this a Tele or Face-to-Face Assessment?: Face-to-Face Is this an Initial Assessment or a Re-assessment for this encounter?: Initial Assessment Marital status: Single Maiden name:  (n/a) Is patient pregnant?: No Pregnancy Status: No Living Arrangements: Parent Can pt return to current living arrangement?: Yes  Admission Status: Voluntary Is patient capable of signing voluntary admission?: Yes Referral Source: Self/Family/Friend Insurance type:  (Self Pay)     Crisis Care Plan Living Arrangements: Parent Name of Psychiatrist: No provider reported at this time. ([facility] appt 03/22/2015) Name of Therapist: No provider reported at this time   Education Status Is patient currently in  school?: No Current Grade:  (n/a) Highest grade of school patient has completed:  (Some college) Name of school: NA Contact person: NA  Risk to self with the past 6 months Suicidal Ideation: No Has patient been a risk to self within the past 6 months prior to admission? : No Suicidal Intent: No Has patient had any suicidal intent within the past 6 months prior to admission? : No Is patient at risk for suicide?: No Suicidal Plan?: No Has patient had any suicidal plan within the past 6 months prior to admission? : No Access to Means: No What has been your use of drugs/alcohol within the last 12 months?:  (No alcohol or drug use reported) Previous Attempts/Gestures: No How many times?:  (n/a) Other Self Harm Risks:  (No self harm reported) Triggers for Past Attempts: None known Intentional Self Injurious Behavior: None Family Suicide History: No Recent stressful life event(s): Other (Comment) (hallucinations) Persecutory voices/beliefs?: Yes Depression: Yes Depression Symptoms: Despondent, Insomnia, Isolating, Guilt, Feeling worthless/self pity, Feeling angry/irritable, Loss of interest in usual pleasures, Fatigue, Tearfulness Substance abuse history and/or treatment for substance abuse?: No Suicide prevention information given to non-admitted patients: Not applicable  Risk to Others within the past 6 months Homicidal Ideation: No Does patient have any lifetime risk of violence toward others beyond the six months prior to admission? : No Thoughts of Harm to Others: No Current Homicidal Intent: No Current Homicidal Plan: No Access to Homicidal Means: No Identified Victim:  (n/a) History of harm to others?: No Assessment of Violence: None Noted Violent Behavior Description:  (No violence) Does patient have access to weapons?: No Criminal Charges Pending?: No Does patient have a court date: No Is patient on probation?: Unknown  Psychosis Hallucinations: Auditory Delusions:  Grandiose  Mental Status Report Appearance/Hygiene: In scrubs Eye Contact: Good Motor Activity: Freedom of movement Speech: Logical/coherent Level of Consciousness: Quiet/awake Mood: Euthymic Affect: Blunted Anxiety Level: None Thought Processes: Coherent, Relevant Judgement: Unimpaired Orientation: Person, Place, Time, Situation Obsessive Compulsive Thoughts/Behaviors: None  Cognitive Functioning Concentration: Fair Memory: Recent Intact, Remote Intact IQ: Average Insight: Fair Impulse Control: Good Appetite: Poor Weight Loss:  (15 pounds in 1 month ) Weight Gain:  (none reported) Sleep: No Change Total Hours of Sleep:  (8 hrs of sleep) Vegetative Symptoms: Staying in bed  ADLScreening [facility] Assessment Services) Patient's cognitive ability adequate to safely complete daily activities?: Yes Patient able to express need for assistance with ADLs?: Yes Independently performs ADLs?: Yes (appropriate for developmental age)  Prior Inpatient Therapy Prior Inpatient Therapy: Yes Prior Therapy Dates: 2012-2015 [facility], [facility]  Reason for Treatment: Schizophrenia   Prior Outpatient Therapy Prior Outpatient Therapy: Yes Prior Therapy Dates: Past history reported dates unknown Prior Therapy Facilty/Provider(s): Pt is unable to recall the name of providers at this time.  Reason for Treatment: Schizophrenia  Does patient have an ACCT team?: No Does patient have Intensive In-House Services?  : No Does patient have [facility] services? : No Does patient have P4CC services?: No  ADL Screening (condition at time of admission) Patient's cognitive ability adequate to safely complete daily activities?: Yes  Is the patient deaf or have difficulty hearing?: No Does the patient have difficulty seeing, even when wearing glasses/contacts?: No Does the patient have difficulty concentrating, remembering, or making  decisions?: Yes Patient able to express need for assistance with ADLs?: Yes Does the patient have difficulty dressing or bathing?: No Independently performs ADLs?: Yes (appropriate for developmental age) Does the patient have difficulty walking or climbing stairs?: No Weakness of Legs: None Weakness of Arms/Hands: None  Home Assistive Devices/Equipment Home Assistive Devices/Equipment: None          Advance Directives (For Healthcare) Does patient have an advance directive?: No    Additional Information 1:1 In Past 12 Months?: No CIRT Risk: No Elopement Risk: No Does patient have medical clearance?: Yes     Disposition:  Disposition Initial Assessment Completed for this Encounter: Yes Disposition of Patient: Inpatient treatment program Type of inpatient treatment program: Adult Nanine Means(Jamison Lord, DNP recommends inpatient treatment)  On Site Evaluation by:   Reviewed with Physician:    Melynda Rippleerry, Ai Sonnenfeld [facility] 03/23/2015 4:39 PM

## 2015-03-23 NOTE — Progress Notes (Signed)
[facility] and cooperative. [facility] auditory hallucinations about celebrity rap artist talking to him and telling him he signed a record label. Pt reports they have been talking to him for 3 years and he is unsure if it is true or not. Pt denies drug or alcohol use and states he has not worked or done anything productive since this began at age 24 but "been in and out of hospitals". Currently pt denies SI/HI/A/V/H during assessment but reports hearing voices "all the time", pt verbally contracts for safety. Emotional support and encouragement given. Pt admitted for evaluation, stabilization and reduction of baseline. Will monitor closely.

## 2015-03-23 NOTE — BH Assessment (Signed)
Writer informed TTS ([place]) of the consult.  

## 2015-03-23 NOTE — Tx Team (Addendum)
Initial Interdisciplinary Treatment Plan   PATIENT STRESSORS: Psychosis   PATIENT STRENGTHS: Ability for insight Capable of independent living Communication skills Motivation for treatment/growth Supportive family/friends   PROBLEM LIST: Problem List/Patient Goals Date to be addressed Date deferred Reason deferred Estimated date of resolution  Psychosis "Need help understanding what is going on" 03/23/15   At d/c  anxiety 03/23/15   At d/c  depression 03/23/15   At d/c  Increased risk for suicidal ideation 03/23/15   At d/c                                 DISCHARGE CRITERIA:  Improved stabilization in mood, thinking, and/or behavior Motivation to continue treatment in a less acute level of care Need for constant or close observation no longer present Verbal commitment to aftercare and medication compliance  PRELIMINARY DISCHARGE PLAN: Outpatient therapy Return to previous living arrangement  PATIENT/FAMIILY INVOLVEMENT: This treatment plan has been presented to and reviewed with the patient, Milford CageLarry Hauk.  The patient and family have been given the opportunity to ask questions and make suggestions.  Celene KrasRobinson, Alfard Cochrane G 03/23/2015, 7:34 PM

## 2015-03-23 NOTE — ED Notes (Signed)
Pt states that he is talking to Berta Minorrake, Nicki Minaj, and Liane ComberJay Z and they keep telling him that he is signed to a record label.  Denies any SI/HI.  Denies substance abuse.

## 2015-03-23 NOTE — Progress Notes (Signed)
Report received from admitting RN.  Introduced self to pt.  Pt describes his mood as "really tired."  [facility] because "I talk to rappers."  Pt reports he converses with rappers daily and that other people can't hear them.  Pt denies visual hallucinations.  Pt denies SI/HI, denies pain.  Pt verbally contracts for safety.  No distress noted.  Will continue to monitor and assess for safety.

## 2015-03-23 NOTE — ED Provider Notes (Signed)
CSN: [id number]     Arrival date & time 03/23/15  1407 History   First MD Initiated Contact with Patient 03/23/15 1531     Chief Complaint  Patient presents with  . Hallucinations     (Consider location/radiation/quality/duration/timing/severity/associated sxs/prior Treatment) HPI Comments: Patient is a 24 year old male with history of psychotic disorder. He presents for evaluation of hallucinations. He states he's been experiencing these for some time now. He tells me that he is a wrap her with a record label and tells me that [place]anye West, WallBeyonce, and Jay-Z speak to him constantly throughout the day. He states he cannot do anything or function due to hearing these voices. He denies to me he has any suicidal or homicidal ideation. He does report previous hospitalizations for psychoses.  Patient is a 24 y.o. male presenting with mental health disorder. The history is provided by the patient.  Mental Health Problem Presenting symptoms: hallucinations   Presenting symptoms: no suicidal thoughts and no suicidal threats   Degree of incapacity (severity):  Moderate Timing:  Constant Progression:  Worsening Chronicity:  Chronic Context: not alcohol use, not drug abuse and not recent medication change   Relieved by:  Nothing Worsened by:  Nothing tried Ineffective treatments:  None tried   Past Medical History  Diagnosis Date  . Schizophrenia    History reviewed. No pertinent past surgical history. History reviewed. No pertinent family history. History  Substance Use Topics  . Smoking status: Never Smoker   . Smokeless tobacco: Not on file  . Alcohol Use: Yes     Comment: socially    Review of Systems  Psychiatric/Behavioral: Positive for hallucinations. Negative for suicidal ideas.  All other systems reviewed and are negative.     Allergies  Review of patient's allergies indicates no known allergies.  Home Medications   Prior to Admission medications   Medication Sig  Start Date End Date Taking? Authorizing Provider  Paliperidone (INVEGA PO) Take 1 capsule by mouth daily.   Yes Historical Provider, MD   BP 107/50 mmHg  Pulse 71  Temp(Src) 98 F (36.7 C) (Oral)  Resp 17  SpO2 100% Physical Exam  Constitutional:  Awake, alert, nontoxic appearance.  HENT:  Head: Atraumatic.  Eyes: Right eye exhibits no discharge. Left eye exhibits no discharge.  Neck: Neck supple.  Pulmonary/Chest: Effort normal. He exhibits no tenderness.  Abdominal: Soft. There is no tenderness. There is no rebound.  Musculoskeletal: He exhibits no tenderness.  Baseline ROM, no obvious new focal weakness.  Neurological:  Mental status and motor strength appears baseline for patient and situation.  Skin: No rash noted.  Psychiatric: His mood appears anxious. His speech is rapid and/or pressured. He is hyperactive and actively hallucinating. Thought content is delusional. Cognition and memory are impaired. He expresses impulsivity.  Nursing note and vitals reviewed.   ED Course  Procedures (including critical care time) Labs Review Labs Reviewed  CBC  ACETAMINOPHEN LEVEL  COMPREHENSIVE METABOLIC PANEL  ETHANOL  SALICYLATE LEVEL  URINE RAPID DRUG SCREEN (HOSP PERFORMED)    Imaging Review No results found.   EKG Interpretation None      MDM   Final diagnoses:  None    Patient presents with complaints of hearing voices. He will be evaluated by TTS and it appears as though he will be transferred to behavioral health for psychiatric admit.    Geoffery Lyonsouglas Masako Overall, MD 03/23/15 2330

## 2015-03-23 NOTE — ED Notes (Addendum)
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 They want more and more and more from me, I don't know what to do anymore, I need some help, I need them to stop talking, I know I'm going crazy but when I go to psychiatric hospital nobody believes me. Please help me, please or I will kill myself, I don't know what else to do".

## 2015-03-24 ENCOUNTER — Other Ambulatory Visit: Payer: Self-pay

## 2015-03-24 ENCOUNTER — Encounter (HOSPITAL_COMMUNITY): Payer: Self-pay | Admitting: Psychiatry

## 2015-03-24 DIAGNOSIS — Z720 Tobacco use: Secondary | ICD-10-CM

## 2015-03-24 DIAGNOSIS — F17200 Nicotine dependence, unspecified, uncomplicated: Secondary | ICD-10-CM | POA: Diagnosis present

## 2015-03-24 DIAGNOSIS — F201 Disorganized schizophrenia: Secondary | ICD-10-CM

## 2015-03-24 MED ORDER — DIPHENHYDRAMINE HCL 50 MG/ML IJ SOLN
25.0000 mg | Freq: Three times a day (TID) | INTRAMUSCULAR | Status: DC | PRN
Start: 2015-03-24 — End: 2015-03-29

## 2015-03-24 MED ORDER — HALOPERIDOL 5 MG PO TABS
5.0000 mg | ORAL_TABLET | Freq: Every day | ORAL | Status: DC
  Administered 2015-03-24 – 2015-03-25 (×2): 5 mg via ORAL
  Filled 2015-03-24 (×4): qty 1

## 2015-03-24 MED ORDER — NICOTINE POLACRILEX 2 MG MT GUM
CHEWING_GUM | OROMUCOSAL | Status: AC
  Administered 2015-03-24: 13:00:00
  Filled 2015-03-24: qty 1

## 2015-03-24 MED ORDER — BOOST / RESOURCE BREEZE PO LIQD
1.0000 | Freq: Two times a day (BID) | ORAL | Status: DC
  Administered 2015-03-25 – 2015-03-27 (×2): 1 via ORAL
  Filled 2015-03-24 (×17): qty 1

## 2015-03-24 MED ORDER — BENZTROPINE MESYLATE 0.5 MG PO TABS
0.5000 mg | ORAL_TABLET | Freq: Every day | ORAL | Status: DC
  Administered 2015-03-24 – 2015-03-25 (×2): 0.5 mg via ORAL
  Filled 2015-03-24 (×4): qty 1

## 2015-03-24 MED ORDER — LORAZEPAM 0.5 MG PO TABS
0.5000 mg | ORAL_TABLET | Freq: Three times a day (TID) | ORAL | Status: DC | PRN
  Administered 2015-03-25: 0.5 mg via ORAL
  Filled 2015-03-24: qty 1

## 2015-03-24 MED ORDER — DIPHENHYDRAMINE HCL 25 MG PO CAPS
25.0000 mg | ORAL_CAPSULE | Freq: Three times a day (TID) | ORAL | Status: DC | PRN
Start: 2015-03-24 — End: 2015-03-29

## 2015-03-24 MED ORDER — HALOPERIDOL 5 MG PO TABS
5.0000 mg | ORAL_TABLET | Freq: Three times a day (TID) | ORAL | Status: DC | PRN
  Administered 2015-03-25: 5 mg via ORAL
  Filled 2015-03-24: qty 1

## 2015-03-24 MED ORDER — LORAZEPAM 2 MG/ML IJ SOLN: 0.5000 mg | Freq: Three times a day (TID) | INTRAMUSCULAR | Status: DC | PRN

## 2015-03-24 MED ORDER — HALOPERIDOL LACTATE 5 MG/ML IJ SOLN: 5.0000 mg | Freq: Three times a day (TID) | INTRAMUSCULAR | Status: DC | PRN
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
## 2015-03-24 NOTE — Tx Team (Signed)
Interdisciplinary Treatment Plan Update (Adult)  Date:  03/24/2015   Time Reviewed:  8:10 AM   Progress in Treatment: Attending groups: Yes. Participating in groups:  Yes. Taking medication as prescribed:  Yes. Tolerating medication:  Yes. Family/Significant othe contact made:  No Patient understands diagnosis:  Yes  As evidenced by seeking help with AH Discussing patient identified problems/goals with staff:  Yes, see initial care plan. Medical problems stabilized or resolved:  Yes. Denies suicidal/homicidal ideation: Yes. Issues/concerns per patient self-inventory:  No. Other:  New problem(s) identified:  Discharge Plan or Barriers:  Return home, follow up outpt  Reason for Continuation of Hospitalization: Depression Hallucinations Medication stabilization  Comments:  Glenn Bolton is an 24 y.o. male [facility] reporting auditory hallucinations. Pt stated "I have schizophrenia and people are telling me that I am signed to a record label in addition to [place]ouncy, Chadicki Mnaj, and [place] Z". "I can hear Ulice BrilliantDrake and Lil'Wayne telling me that I am a rapper". "They are saying that I am signed to a record label and that's what keeps me motivated". Patient also hears voices telling him, "People never die" and "Go to the mall to get whatever you want because it's all free". Patient stating "It is hard for me to have a conversation because I can hear voices 24/7".   Pt denies SI and HI at this time. Pt is reporting auditory hallucinations. Pt reported that he has a history of schizophrenia with a [facility]. Patient recently [facility] 1[facility] weeks ago. Sts that at [facility] he was prescribed Invega (Injection). Patient in the past tried Zyprexa and Seroquel but sts they didn't work for him.  Haldol, Trazodone trial  Estimated length of stay: 4-5 days  New goal(s):  Review of initial/current patient goals per problem  list:     Attendees: Patient:  03/24/2015 8:10 AM   Family:   03/24/2015 8:10 AM   Physician:  Jomarie LongsSaramma Eappen, MD 03/24/2015 8:10 AM   Nursing:   Marzetta Boardhrista Dopson, RN 03/24/2015 8:10 AM   CSW:    Daryel Geraldodney Sharah Finnell, LCSW   03/24/2015 8:10 AM   Other:  03/24/2015 8:10 AM   Other:   03/24/2015 8:10 AM   Other:  Onnie BoerJennifer Clark, Nurse CM 03/24/2015 8:10 AM   Other:  Leisa LenzValerie Enoch, [facility] 03/24/2015 8:10 AM   Other:  Tomasita Morrowelora Sutton, P4CC  03/24/2015 8:10 AM   Other:  03/24/2015 8:10 AM   Other:  03/24/2015 8:10 AM   Other:  03/24/2015 8:10 AM   Other:  03/24/2015 8:10 AM   Other:  03/24/2015 8:10 AM   Other:   03/24/2015 8:10 AM    Scribe for Treatment Team:   Ida RogueNorth, Guiliana Shor B, 03/24/2015 8:10 AM

## 2015-03-24 NOTE — Plan of Care (Signed)
Problem: Alteration in thought process Goal: STG-Patient is able to follow short directions Outcome: Progressing Patient is able to follow short directions without difficulty.

## 2015-03-24 NOTE — BHH Group Notes (Signed)
[facility]   03/24/2015 10:12 AM  Participation Quality:  Minimal  Mood/Affect:  Flat  Depression Rating:    Anxiety Rating:    Thoughts of Suicide:  No Will you contract for safety?   NA  Current AVH:  Yes  Plan for Discharge/Comments:  Reluctant to talk in front of others.  States it was his idea to come in, but prefers to not say why.  [place].  Not working nor in school.  [facility]  Transportation Means:   Supports:  Daryel GeraldNorth, Joshlyn Beadle B

## 2015-03-24 NOTE — BHH Group Notes (Signed)
BHH LCSW Group Therapy  03/24/2015  1:05 PM  Type of Therapy:  Group therapy  Participation Level:  Active  Participation Quality:  Attentive  Affect:  Flat  Cognitive:  Oriented  Insight:  Limited  Engagement in Therapy:  Limited  Modes of Intervention:  Discussion, Socialization  Summary of Progress/Problems:  Chaplain was here to lead a group on themes of hope and courage.  "My mother is very supportive.  I also have other family, but [place] are in [place]."  [place] on to talk about how he is having to build a new support system since he moved her 6 mos ago, and identified people who share the same interests.  "I usually don't come to groups in hospitals, But I need to try to figure out what's going on.  And I feel like people really listen here."  Ida Rogueorth, Alya Smaltz B 03/24/2015 11:39 AM

## 2015-03-24 NOTE — Progress Notes (Signed)
Patient ID: Glenn CageLarry Bolton, male   DOB: 05-31-1991, 24 y.o.   MRN: [id number]  Patient's BP was low this morning. Writer spoke to patient about the importance of staying hydrated. Writer provided patient with Gatorade and patient verbalized understanding of drinking plenty of fluids. Patient is asymptomatic at this time.

## 2015-03-24 NOTE — Clinical Social Work Note (Signed)
[facility] has [facility] care coordinator, Remigio EisenmengerKaren Rudd [phone]).  Santa GeneraAnne Cunningham, LCSW Clinical Social Worker

## 2015-03-24 NOTE — Progress Notes (Signed)
NUTRITION ASSESSMENT  Pt identified as at risk on the Malnutrition Screen Tool  INTERVENTION: 1. Educated patient on the importance of nutrition and encouraged intake of food and beverages. 2. Discussed weight goals. 3. Supplements: will d/c Ensure Enlive and order Resource Breeze po BID, each supplement provides 250 kcal and 9 grams of protein   NUTRITION DIAGNOSIS: Unintentional weight loss related to sub-optimal intake as evidenced by pt report.   Goal: Pt to meet >/= 90% of their estimated nutrition needs.  Monitor:  PO intake  Assessment:  Pt seen for MST. Pt reports that he ate well at breakfast and does not have any abdominal pain or nausea after eating. He states that PTA he had a good appetite. He then states that he lost 10 lbs in the past month due to not eating as much but has gained back 5 pounds within the past 1.5 weeks due to increased appetite and intakes. RN and pt report that pt does not like Ensure; will d/c and order Raytheonesource Breeze instead.   24 y.o. male  Height: Ht Readings from Last 1 Encounters:  03/23/15 5\' 10"  (1.778 m)    Weight: Wt Readings from Last 1 Encounters:  03/23/15 162 lb (73.483 kg)    Weight Hx: Wt Readings from Last 10 Encounters:  03/23/15 162 lb (73.483 kg)  08/16/14 165 lb (74.844 kg)    BMI:  Body mass index is 23.24 kg/(m^2). Pt meets criteria for normal weight based on current BMI.  Estimated Nutritional Needs: Kcal: 25-30 kcal/kg Protein: > 1 gram protein/kg Fluid: 1 ml/kcal  Diet Order: Diet regular Room service appropriate?: Yes; Fluid consistency:: Thin Pt is also offered choice of unit snacks mid-morning and mid-afternoon.  Pt is eating as desired.   Lab results and medications reviewed.    Trenton GammonJessica Shantel Helwig, RD, LDN Inpatient Clinical Dietitian Pager # [phone] After hours/weekend pager # [phone]

## 2015-03-24 NOTE — Progress Notes (Signed)
Patient ID: Glenn Bolton, male   DOB: 05-Mar-1991, 24 y.o.   MRN: [id number]  DAR: Pt. Denies SI/HI and A/V Hallucinations to this writer however patient is seen in the hallway speaking to himself, responding to internal stimuli. Patient appears somewhat anxious and rates his anxiety 10/10 today. Patient rates his depression 5/10 and hopelessness 0/10. Patient reports sleep was good last night, appetite is good, energy level is low, and concentration level is good. Patient does not report any pain or discomfort at this time. Patient's BP was low and pulse high this morning. Writer encouraged patient to drink plenty of fluids. Patient's vitals were checked later and were WDL. Support and encouragement provided to the patient to attend groups because patient reported he did not want to attend due to it not helping in past psych admissions (to other facilities). Patient said he would try and has been seen attending groups. Patient is seen in the milieu pacing the halls but does not appear agitated or in any distress. Scheduled medications administered to patient per physician's orders. Patient is receptive and cooperative but minimal and forwards little. Q15 minute checks are maintained for safety.

## 2015-03-24 NOTE — H&P (Signed)
Psychiatric Admission Assessment Adult  Patient Identification: Glenn Bolton MRN:  [id number] Date of Evaluation:  03/24/2015 Chief Complaint:  schizophrenia     Principal Diagnosis: Schizophrenia, multiple episodes ,currently in acute episode      Diagnosis:   Patient Active Problem List   Diagnosis Date Noted  . Schizophrenia [F20.9] 03/23/2015      History of Present Illness:: Glenn Bolton is a 47 y old AAM ,who is single , unemployed , has applied for disability (states it is approved and that he will get his first check next week), lives with his mother in [place], has a past hx of schizophrenia ( diagnosed 3 yrs ago) , presented to [facility] , reporting AH.   Patient seen today. Pt seen as delusional , talks about rappers talking to him directly , states he does not know how to explain it, but they are not mere voices , but they talk to him through him. Pt has been signed to record labels with these rappers , he has been talking to [employer], [place] as well as others. Pt states that they are getting closer and closer and would not stop talking , and it has become frustrating to him. He denies sleep issues , appetite changes , feeling tired . He does report feeling depressed about his situation. Pt denies SI/HI. Pt denies paranoia. Pt denies mood swings.  Pt reports being diagnosed with schizophrenia 3 yrs ago. Pt was hospitalized in [place] x2-3 . Pt relocated to [place] <1 YR ago to live with his mother ,since he was having relational struggles with his uncle in [place]. Pt reports having two hospitalizations after being in . He was at [facility] ( 08/2014 - per EHR), [place] ( recently). Pt reports following up with [facility], and being on Invega sustenna , but reports Invega as not effective. Pt this time does not want to be discharged on an LAI ,would take the oral pill.  Pt denies any hx of sexual or physical abuse. Pt denies any hx of suicide attempts. Pt denies any hx of substance abuse , other  than smoking tobacco.  Pt reports that his SSD has been approved and he will start getting a check starting next week ( this could not be verified).  Attempted to contact mother - number noted in Face sheet - no response.  Pt per review of EHR - was diagnosed with Bipolar do in the past [facility]) - however could not elicit any bipolar do sx at this time during this admission. Pt also denied any hx of mania, depression.   Elements:  Location:  psychosis. Quality:  delusions,AH,irrelevant,lack of efficacy to medications. Severity:  severe. Timing:  past few days. Duration:  chronic , but worsening. Context:  hx of schizophrenia, lack of efficacy to [facility]. Associated Signs/Symptoms: Depression Symptoms:  depressed mood, (Hypo) Manic Symptoms:  Delusions, Anxiety Symptoms:  denies Psychotic Symptoms:  Delusions, Hallucinations: Auditory PTSD Symptoms: Negative Total Time spent with patient: 1 hour  Past Medical History:  Past Medical History  Diagnosis Date  . Schizophrenia    History reviewed. No pertinent past surgical history. Family History:  Family History  Problem Relation Age of Onset  . Depression Sister   . Diabetes Other    Social History:  History  Alcohol Use  . Yes    Comment: socially     History  Drug Use  . Yes  . Special: Marijuana    History   Social History  . Marital Status:  Single    Spouse Name: N/A  . Number of Children: N/A  . Years of Education: N/A   Social History Main Topics  . Smoking status: Never Smoker   . Smokeless tobacco: Not on file  . Alcohol Use: Yes     Comment: socially  . Drug Use: Yes    Special: Marijuana  . Sexual Activity: No   Other Topics Concern  . None   Social History Narrative   Additional Social History:    Pain Medications: denies Prescriptions: see PTA list Over the Counter: denies History of alcohol / drug use?: No history of alcohol / drug abuse      Patient was born in [place] . Pt was  raised by his mother. Pt went up to college , but dropped out. Pt is single. Pt reports currently living with mom in [place]. Pt reports being in jail in the past for unauthorized use of vehicle. Pt reports being on disability.               Musculoskeletal: Strength & Muscle Tone: within normal limits Gait & Station: normal Patient leans: N/A  Psychiatric Specialty Exam: Physical Exam  Constitutional: He is oriented to person, place, and time. He appears well-developed and well-nourished.  HENT:  Head: Normocephalic and atraumatic.  Eyes: Conjunctivae and EOM are normal. Pupils are equal, round, and reactive to light.  Neck: Normal range of motion. Neck supple.  Cardiovascular: Normal rate and regular rhythm.   Respiratory: Effort normal and breath sounds normal.  GI: Soft.  Musculoskeletal: Normal range of motion.  Neurological: He is alert and oriented to person, place, and time.  Skin: Skin is warm.  Psychiatric: His speech is normal. He is withdrawn and actively hallucinating (TALKING TO SELF). Thought content is delusional. Cognition and memory are normal. He expresses impulsivity. He exhibits a depressed mood.    Review of Systems  Psychiatric/Behavioral: Positive for depression and hallucinations.  All other systems reviewed and are negative.   Blood pressure 116/79, pulse 99, temperature 98.2 F (36.8 C), temperature source Oral, resp. rate 16, height $RemoveBe'5\' 10"'vpxWFIGlu$  (1.778 m), weight 73.483 kg (162 lb).Body mass index is 23.24 kg/(m^2).  General Appearance: Fairly Groomed  Engineer, water::  Fair  Speech:  Normal Rate  Volume:  Normal  Mood:  Depressed  Affect:  Congruent  Thought Process:  Disorganized  Orientation:  Full (Time, Place, and Person)  Thought Content:  Delusions and Hallucinations: Auditory  Suicidal Thoughts:  No  Homicidal Thoughts:  No  Memory:  Immediate;   Fair Recent;   Fair Remote;   Fair  Judgement:  Impaired  Insight:  Shallow  Psychomotor Activity:   Normal  Concentration:  Poor  Recall:  [employer] of Knowledge:Fair  Language: Fair  Akathisia:  No  Handed:  Right  AIMS (if indicated):     Assets:  Desire for Improvement Physical Health  ADL's:  Intact  Cognition: WNL  Sleep:  Number of Hours: 6.75   Risk to Self: Is patient at risk for suicide?: No What has been your use of drugs/alcohol within the last 12 months?: Denies Risk to Others:  denies Prior Inpatient Therapy:  yes atleast 4 times - [place], [place] ,[place] Prior Outpatient Therapy:  [facility]  Alcohol Screening: 1. How often do you have a drink containing alcohol?: Never 2. How many drinks containing alcohol do you have on a typical day when you are drinking?: 1 or 2 3. How often do  you have six or more drinks on one occasion?: Never Preliminary Score: 0 4. How often during the last year have you found that you were not able to stop drinking once you had started?: Never 5. How often during the last year have you failed to do what was normally expected from you becasue of drinking?: Never 6. How often during the last year have you needed a first drink in the morning to get yourself going after a heavy drinking session?: Never 7. How often during the last year have you had a feeling of guilt of remorse after drinking?: Never 8. How often during the last year have you been unable to remember what happened the night before because you had been drinking?: Never 9. Have you or someone else been injured as a result of your drinking?: No 10. Has a relative or friend or a doctor or another health worker been concerned about your drinking or suggested you cut down?: No Alcohol Use Disorder Identification Test Final Score (AUDIT): 0 Brief Intervention: AUDIT score less than 7 or less-screening does not suggest unhealthy drinking-brief intervention not indicated  Allergies:  No Known Allergies Lab Results:  Results for orders placed or performed during the hospital encounter  of 03/23/15 (from the past 48 hour(s))  Acetaminophen level     Status: Abnormal   Collection Time: 03/23/15  3:15 PM  Result Value Ref Range   Acetaminophen (Tylenol), Serum <10 (L) 10 - 30 ug/mL    Comment:        THERAPEUTIC CONCENTRATIONS VARY SIGNIFICANTLY. A RANGE OF 10-30 ug/mL MAY BE AN EFFECTIVE CONCENTRATION FOR MANY PATIENTS. HOWEVER, SOME ARE BEST TREATED AT CONCENTRATIONS OUTSIDE THIS RANGE. ACETAMINOPHEN CONCENTRATIONS >150 ug/mL AT 4 HOURS AFTER INGESTION AND >50 ug/mL AT 12 HOURS AFTER INGESTION ARE OFTEN ASSOCIATED WITH TOXIC REACTIONS.   CBC     Status: None   Collection Time: 03/23/15  3:15 PM  Result Value Ref Range   WBC 6.6 4.0 - 10.5 K/uL   RBC 4.63 4.22 - 5.81 MIL/uL   Hemoglobin 14.8 13.0 - 17.0 g/dL   HCT 42.2 39.0 - 52.0 %   MCV 91.1 78.0 - 100.0 fL   MCH 32.0 26.0 - 34.0 pg   MCHC 35.1 30.0 - 36.0 g/dL   RDW 11.9 11.5 - 15.5 %   Platelets 187 150 - 400 K/uL  Comprehensive metabolic panel     Status: Abnormal   Collection Time: 03/23/15  3:15 PM  Result Value Ref Range   Sodium 139 135 - 145 mmol/L   Potassium 4.1 3.5 - 5.1 mmol/L   Chloride 104 101 - 111 mmol/L   CO2 26 22 - 32 mmol/L   Glucose, Bld 88 65 - 99 mg/dL   BUN 12 6 - 20 mg/dL   Creatinine, Ser 1.09 0.61 - 1.24 mg/dL   Calcium 9.7 8.9 - 10.3 mg/dL   Total Protein 8.1 6.5 - 8.1 g/dL   Albumin 4.5 3.5 - 5.0 g/dL   AST 20 15 - 41 U/L   ALT 9 (L) 17 - 63 U/L   Alkaline Phosphatase 54 38 - 126 U/L   Total Bilirubin 0.9 0.3 - 1.2 mg/dL   GFR calc non Af Amer >60 >60 mL/min   GFR calc Af Amer >60 >60 mL/min    Comment: (NOTE) The eGFR has been calculated using the CKD EPI equation. This calculation has not been validated in all clinical situations. eGFR's persistently <60 mL/min signify possible Chronic Kidney  Disease.    Anion gap 9 5 - 15  Ethanol (ETOH)     Status: None   Collection Time: 03/23/15  3:15 PM  Result Value Ref Range   Alcohol, Ethyl (B) <5 <5 mg/dL     Comment:        LOWEST DETECTABLE LIMIT FOR SERUM ALCOHOL IS 11 mg/dL FOR MEDICAL PURPOSES ONLY   Salicylate level     Status: None   Collection Time: 03/23/15  3:15 PM  Result Value Ref Range   Salicylate Lvl <2.0 2.8 - 30.0 mg/dL  Urine Drug Screen     Status: None   Collection Time: 03/23/15  3:51 PM  Result Value Ref Range   Opiates NONE DETECTED NONE DETECTED   Cocaine NONE DETECTED NONE DETECTED   Benzodiazepines NONE DETECTED NONE DETECTED   Amphetamines NONE DETECTED NONE DETECTED   Tetrahydrocannabinol NONE DETECTED NONE DETECTED   Barbiturates NONE DETECTED NONE DETECTED    Comment:        DRUG SCREEN FOR MEDICAL PURPOSES ONLY.  IF CONFIRMATION IS NEEDED FOR ANY PURPOSE, NOTIFY LAB WITHIN 5 DAYS.        LOWEST DETECTABLE LIMITS FOR URINE DRUG SCREEN Drug Class       Cutoff (ng/mL) Amphetamine      1000 Barbiturate      200 Benzodiazepine   254 Tricyclics       270 Opiates          300 Cocaine          300 THC              50    Current Medications: Current Facility-Administered Medications  Medication Dose Route Frequency Provider Last Rate Last Dose  . acetaminophen (TYLENOL) tablet 650 mg  650 mg Oral Q6H PRN Harriet Butte, NP      . alum & mag hydroxide-simeth (MAALOX/MYLANTA) 200-200-20 MG/5ML suspension 30 mL  30 mL Oral Q4H PRN Harriet Butte, NP      . benztropine (COGENTIN) tablet 0.5 mg  0.5 mg Oral Daily Lorriane Dehart, MD   0.5 mg at 03/24/15 1014  . diphenhydrAMINE (BENADRYL) capsule 25 mg  25 mg Oral Q8H PRN Ursula Alert, MD       Or  . diphenhydrAMINE (BENADRYL) injection 25 mg  25 mg Intramuscular Q8H PRN Loveda Colaizzi, MD      . feeding supplement (RESOURCE BREEZE) (RESOURCE BREEZE) liquid 1 Container  1 Container Oral BID BM Rosezetta Schlatter, RD   1 Container at 03/24/15 1016  . haloperidol (HALDOL) tablet 5 mg  5 mg Oral Daily Emilygrace Grothe, MD   5 mg at 03/24/15 1014  . haloperidol (HALDOL) tablet 5 mg  5 mg Oral Q8H PRN Ursula Alert, MD       Or  . haloperidol lactate (HALDOL) injection 5 mg  5 mg Intramuscular Q8H PRN Esteen Delpriore, MD      . hydrOXYzine (ATARAX/VISTARIL) tablet 25 mg  25 mg Oral TID PRN Harriet Butte, NP      . LORazepam (ATIVAN) tablet 0.5 mg  0.5 mg Oral Q8H PRN Ursula Alert, MD       Or  . LORazepam (ATIVAN) injection 0.5 mg  0.5 mg Intramuscular Q8H PRN Weltha Cathy, MD      . magnesium hydroxide (MILK OF MAGNESIA) suspension 30 mL  30 mL Oral Daily PRN Harriet Butte, NP      . nicotine polacrilex (NICORETTE) gum 2 mg  2 mg Oral PRN Harriet Butte, NP   2 mg at 03/24/15 3976  . traZODone (DESYREL) tablet 50 mg  50 mg Oral QHS PRN Harriet Butte, NP   50 mg at 03/24/15 0126   PTA Medications: Prescriptions prior to admission  Medication Sig Dispense Refill Last Dose  . Paliperidone (INVEGA PO) Take 1 capsule by mouth daily.   Past Week at Unknown time    Previous Psychotropic Medications: Yes , invega, zyprexa, seroquel  Substance Abuse History in the last 12 months:  Yes.  tobacco abuse    Consequences of Substance Abuse: Negative  Results for orders placed or performed during the hospital encounter of 03/23/15 (from the past 72 hour(s))  Acetaminophen level     Status: Abnormal   Collection Time: 03/23/15  3:15 PM  Result Value Ref Range   Acetaminophen (Tylenol), Serum <10 (L) 10 - 30 ug/mL    Comment:        THERAPEUTIC CONCENTRATIONS VARY SIGNIFICANTLY. A RANGE OF 10-30 ug/mL MAY BE AN EFFECTIVE CONCENTRATION FOR MANY PATIENTS. HOWEVER, SOME ARE BEST TREATED AT CONCENTRATIONS OUTSIDE THIS RANGE. ACETAMINOPHEN CONCENTRATIONS >150 ug/mL AT 4 HOURS AFTER INGESTION AND >50 ug/mL AT 12 HOURS AFTER INGESTION ARE OFTEN ASSOCIATED WITH TOXIC REACTIONS.   CBC     Status: None   Collection Time: 03/23/15  3:15 PM  Result Value Ref Range   WBC 6.6 4.0 - 10.5 K/uL   RBC 4.63 4.22 - 5.81 MIL/uL   Hemoglobin 14.8 13.0 - 17.0 g/dL   HCT 42.2 39.0 - 52.0 %   MCV  91.1 78.0 - 100.0 fL   MCH 32.0 26.0 - 34.0 pg   MCHC 35.1 30.0 - 36.0 g/dL   RDW 11.9 11.5 - 15.5 %   Platelets 187 150 - 400 K/uL  Comprehensive metabolic panel     Status: Abnormal   Collection Time: 03/23/15  3:15 PM  Result Value Ref Range   Sodium 139 135 - 145 mmol/L   Potassium 4.1 3.5 - 5.1 mmol/L   Chloride 104 101 - 111 mmol/L   CO2 26 22 - 32 mmol/L   Glucose, Bld 88 65 - 99 mg/dL   BUN 12 6 - 20 mg/dL   Creatinine, Ser 1.09 0.61 - 1.24 mg/dL   Calcium 9.7 8.9 - 10.3 mg/dL   Total Protein 8.1 6.5 - 8.1 g/dL   Albumin 4.5 3.5 - 5.0 g/dL   AST 20 15 - 41 U/L   ALT 9 (L) 17 - 63 U/L   Alkaline Phosphatase 54 38 - 126 U/L   Total Bilirubin 0.9 0.3 - 1.2 mg/dL   GFR calc non Af Amer >60 >60 mL/min   GFR calc Af Amer >60 >60 mL/min    Comment: (NOTE) The eGFR has been calculated using the CKD EPI equation. This calculation has not been validated in all clinical situations. eGFR's persistently <60 mL/min signify possible Chronic Kidney Disease.    Anion gap 9 5 - 15  Ethanol (ETOH)     Status: None   Collection Time: 03/23/15  3:15 PM  Result Value Ref Range   Alcohol, Ethyl (B) <5 <5 mg/dL    Comment:        LOWEST DETECTABLE LIMIT FOR SERUM ALCOHOL IS 11 mg/dL FOR MEDICAL PURPOSES ONLY   Salicylate level     Status: None   Collection Time: 03/23/15  3:15 PM  Result Value Ref Range   Salicylate Lvl <7.3 2.8 - 30.0  mg/dL  Urine Drug Screen     Status: None   Collection Time: 03/23/15  3:51 PM  Result Value Ref Range   Opiates NONE DETECTED NONE DETECTED   Cocaine NONE DETECTED NONE DETECTED   Benzodiazepines NONE DETECTED NONE DETECTED   Amphetamines NONE DETECTED NONE DETECTED   Tetrahydrocannabinol NONE DETECTED NONE DETECTED   Barbiturates NONE DETECTED NONE DETECTED    Comment:        DRUG SCREEN FOR MEDICAL PURPOSES ONLY.  IF CONFIRMATION IS NEEDED FOR ANY PURPOSE, NOTIFY LAB WITHIN 5 DAYS.        LOWEST DETECTABLE LIMITS FOR URINE DRUG  SCREEN Drug Class       Cutoff (ng/mL) Amphetamine      1000 Barbiturate      200 Benzodiazepine   588 Tricyclics       502 Opiates          300 Cocaine          300 THC              50     Observation Level/Precautions:  15 minute checks  Laboratory:  ekg for qtc, reviewed labs done in ED  Psychotherapy:  Individual and group therapy   Medications:  As below  Consultations:  Social worker  Discharge Concerns:  Stability and safety       Psychological Evaluations: No   Treatment Plan Summary: Daily contact with patient to assess and evaluate symptoms and progress in treatment and Medication management  Patient will benefit from inpatient treatment and stabilization.  Estimated length of stay is 5-7 days.  Reviewed past medical records,treatment plan.  Will discontinue Invega for lack of efficacy. Will start Haldol 5 mg po daily for psychosis. Will add Cogentin 0.5 mg po daily for EPS. Will make available Haldol/benadryl/ativan PO/IM prn for severe anxiety/agitation. Provided smoking cessation counseling - nicotine gum . Will continue to monitor vitals ,medication compliance and treatment side effects while patient is here.  Will monitor for medical issues as well as call consult as needed.  Reviewed labs CBC,CMP,UDS,etoh level - wnl (03/23/15) - will order EKG. CSW will start working on disposition.  Patient to participate in therapeutic milieu .       Medical Decision Making:  Review of Psycho-Social Stressors (1), Review or order clinical lab tests (1), Review and summation of old records (2), Review of Last Therapy Session (1), Review of Medication Regimen & Side Effects (2) and Review of New Medication or Change in Dosage (2)  I certify that inpatient services furnished can reasonably be expected to improve the patient's condition.   Adele Milson md 5/20/201612:14 PM

## 2015-03-24 NOTE — Progress Notes (Signed)
[facility] Group Notes:  (Nursing/MHT/Case Management/Adjunct)  Date:  03/24/2015  Time:  11:49 PM  Type of Therapy:  Group Therapy  Participation Level:  Did Not Attend  Participation Quality:  Did Not Attend  Affect:  Did Not Attend  Cognitive:  Did Not Attend  Insight:  None  Engagement in Group:  Did Not Attend  Modes of Intervention:  Socialization and Support  Summary of Progress/Problems: Pt. Was resting in bed.  Sondra ComeWilson, Ryler Laskowski J 03/24/2015, 11:49 PM
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
## 2015-03-25 DIAGNOSIS — F200 Paranoid schizophrenia: Secondary | ICD-10-CM

## 2015-03-25 LAB — LIPID PANEL
CHOLESTEROL: 186 mg/dL (ref 0–200)
HDL: 47 mg/dL (ref >40)
LDL CALC: 125 mg/dL — AB (ref 0–99)
Total CHOL/HDL Ratio: 4 RATIO
Triglycerides: 70 mg/dL (ref <150)
VLDL: 14 mg/dL (ref 0–40)

## 2015-03-25 LAB — TSH: TSH: 0.997 u[IU]/mL (ref 0.350–4.500)

## 2015-03-25 MED ORDER — BENZTROPINE MESYLATE 0.5 MG PO TABS
0.5000 mg | ORAL_TABLET | Freq: Two times a day (BID) | ORAL | Status: DC
  Administered 2015-03-25 – 2015-03-27 (×4): 0.5 mg via ORAL
  Filled 2015-03-25 (×6): qty 1

## 2015-03-25 MED ORDER — HALOPERIDOL 5 MG PO TABS
5.0000 mg | ORAL_TABLET | Freq: Two times a day (BID) | ORAL | Status: DC
  Administered 2015-03-25 – 2015-03-27 (×4): 5 mg via ORAL
  Filled 2015-03-25 (×7): qty 1

## 2015-03-25 NOTE — [facility] Group Notes (Signed)
[facility] Group Notes:  (Nursing/MHT/Case Management/Adjunct)  Date:  03/25/2015  Time:  10:39 AM  Type of Therapy:  Psychoeducational Skills  Participation Level:  Active  Participation Quality:  Appropriate  Affect:  Appropriate  Cognitive:  Appropriate  Insight:  Appropriate  Engagement in Group:  Engaged  Modes of Intervention:  Discussion  Summary of Progress/Problems: Pt did attend self inventory group, pt reported that he was negative SI/HI, no AH/VH noted. Pt rated his depression as a 6, his anxiety as a 7 and his helplessness/hopelessness as a 0.     Pt reported no issues or concerns.   Jacquelyne BalintForrest, Kito Cuffe Shanta 03/25/2015, 10:39 AM

## 2015-03-25 NOTE — Progress Notes (Signed)
Did not attend group 

## 2015-03-25 NOTE — [facility] Group Notes (Signed)
[facility] Group Notes: (Clinical Social Work)   03/25/2015      Type of Therapy:  Group Therapy   Participation Level:  Did Not Attend despite MHT prompting   Ambrose MantleMareida Grossman-Orr, LCSW 03/25/2015, 12:27 PM

## 2015-03-25 NOTE — Progress Notes (Signed)
Patient ID: Glenn CageLarry Bolton, male   DOB: Sep 20, 1991, 24 y.o.   MRN: [id number]   D: Pt has been very flat and depressed on the unit today, he spent most of the morning pacing the halls and talking to himself. Pt reported in group that he was schizophrenic and that he talks to himself all the time and [facility]'s [facility] Pt attended group and engaged in treatment. Pt reported on his self inventory sheet that his depression was a 0, his hopelessness was a 0, and his anxiety was a 5. Pt reported that his anxiety was worst, but was given prn medication that helped him. Pt reported that his goal for today was to have a good day. Pt reported being negative SI/HI, no AH/VH noted. A: 15 min checks continued for patient safety. R: Pt safety maintained.

## 2015-03-25 NOTE — Progress Notes (Signed)
Patient has been observed lying in bed asleep most of the shift other than him coming out to ask for 2 juices. Writer asked if he felt ok and he reported he was just tired. He denied having pain, si/hi/a/v hallucintondHe got his juice and returned to  his room and rested

## 2015-03-25 NOTE — Progress Notes (Signed)
[facility] MD Progress Note  03/25/2015 9:56 AM Glenn Bolton  MRN:  [id number] Subjective:  I still hear voices.  I'm very anxious.  Objective: Patient seen chart reviewed.  Patient remains very guarded, paranoid and disorganized.  He continued to endorse auditory hallucination and admitted listening music and cannot concentrate.  He appears responding to internal stimuli.  He has a very limited interaction with staff and he did not go to any groups.  He is taking Haldol.  He admitted it helps some sleep but he remains very anxious nervous and pacing in the hallway.  He reported no side effects of medication.  Though he denies any suicidal thoughts or homicidal thought but remains very disorganized with inappropriate laughing and giggling.  He has thought blocking and he maintained poor eye contact.  He continued to believe that he's been talking to super stars and he is trying to help them by making a music for them.  His appetite is okay.  His vitals are stable.   Principal Problem: Schizophrenia Diagnosis:   Patient Active Problem List   Diagnosis Date Noted  . Tobacco use disorder [Z72.0] 03/24/2015  . Schizophrenia [F20.9] 03/23/2015   Total Time spent with patient: 20 minutes   Past Medical History:  Past Medical History  Diagnosis Date  . Schizophrenia    History reviewed. No pertinent past surgical history. Family History:  Family History  Problem Relation Age of Onset  . Depression Sister   . Diabetes Other    Social History:  History  Alcohol Use  . Yes    Comment: socially     History  Drug Use  . Yes  . Special: Marijuana    History   Social History  . Marital Status: Single    Spouse Name: N/A  . Number of Children: N/A  . Years of Education: N/A   Social History Main Topics  . Smoking status: Never Smoker   . Smokeless tobacco: Not on file  . Alcohol Use: Yes     Comment: socially  . Drug Use: Yes    Special: Marijuana  . Sexual Activity: No   Other  Topics Concern  . None   Social History Narrative   Additional History:    Sleep: Poor  Appetite:  Fair   Assessment:   Musculoskeletal: Strength & Muscle Tone: within normal limits Gait & Station: normal Patient leans: N/A   Psychiatric Specialty Exam: Physical Exam  Review of Systems  Constitutional: Negative.   Eyes: Negative for blurred vision.  Respiratory: Negative.   Cardiovascular: Negative for chest pain and palpitations.  Skin: Negative for itching and rash.  Neurological: Negative for dizziness and tremors.  Psychiatric/Behavioral: Positive for hallucinations. The patient is nervous/anxious and has insomnia.     Blood pressure 127/91, pulse 104, temperature 97.4 F (36.3 C), temperature source Oral, resp. rate 18, height  (1.778 m), weight 73.483 kg (162 lb).Body mass index is 23.24 kg/(m^2).  General Appearance: Guarded  Eye Contact::  Minimal  Speech:  Slow  Volume:  Decreased  Mood:  Anxious, Depressed, Hopeless and Irritable  Affect:  Flat  Thought Process:  Disorganized  Orientation:  Full (Time, Place, and Person)  Thought Content:  Hallucinations: Auditory, Ideas of Reference:   Paranoia, Paranoid Ideation and Rumination  Suicidal Thoughts:  No  Homicidal Thoughts:  No  Memory:  Immediate;   Fair Recent;   Fair Remote;   Fair  Judgement:  Impaired  Insight:  Lacking  Psychomotor Activity:  Increased  Concentration:  Poor  Recall:  [employer]  Fund of Knowledge:Fair  Language: Fair  Akathisia:  No  Handed:  Right  AIMS (if indicated):     Assets:  Desire for Improvement Housing Social Support  ADL's:  Intact  Cognition: WNL  Sleep:  Number of Hours: 6.5     Current Medications: Current Facility-Administered Medications  Medication Dose Route Frequency Provider Last Rate Last Dose  . acetaminophen (TYLENOL) tablet 650 mg  650 mg Oral Q6H PRN Worthy FlankIjeoma E Nwaeze, NP      . alum & mag hydroxide-simeth (MAALOX/MYLANTA) 200-200-20 MG/5ML  suspension 30 mL  30 mL Oral Q4H PRN Worthy FlankIjeoma E Nwaeze, NP      . benztropine (COGENTIN) tablet 0.5 mg  0.5 mg Oral BID Cleotis NipperSyed T Oz Gammel, MD      . diphenhydrAMINE (BENADRYL) capsule 25 mg  25 mg Oral Q8H PRN Jomarie LongsSaramma Eappen, MD       Or  . diphenhydrAMINE (BENADRYL) injection 25 mg  25 mg Intramuscular Q8H PRN Saramma Eappen, MD      . feeding supplement (RESOURCE BREEZE) (RESOURCE BREEZE) liquid 1 Container  1 Container Oral BID BM Renie OraJessica M Ostheim, RD   1 Container at 03/25/15 [id number]  . haloperidol (HALDOL) tablet 5 mg  5 mg Oral Q8H PRN Jomarie LongsSaramma Eappen, MD   5 mg at 03/25/15 0841   Or  . haloperidol lactate (HALDOL) injection 5 mg  5 mg Intramuscular Q8H PRN Saramma Eappen, MD      . haloperidol (HALDOL) tablet 5 mg  5 mg Oral BID Cleotis NipperSyed T Kelvis Berger, MD      . hydrOXYzine (ATARAX/VISTARIL) tablet 25 mg  25 mg Oral TID PRN Worthy FlankIjeoma E Nwaeze, NP   25 mg at 03/25/15 0841  . LORazepam (ATIVAN) tablet 0.5 mg  0.5 mg Oral Q8H PRN Jomarie LongsSaramma Eappen, MD   0.5 mg at 03/25/15 0841   Or  . LORazepam (ATIVAN) injection 0.5 mg  0.5 mg Intramuscular Q8H PRN Saramma Eappen, MD      . magnesium hydroxide (MILK OF MAGNESIA) suspension 30 mL  30 mL Oral Daily PRN Worthy FlankIjeoma E Nwaeze, NP      . nicotine polacrilex (NICORETTE) gum 2 mg  2 mg Oral PRN Worthy FlankIjeoma E Nwaeze, NP   2 mg at 03/25/15 0844  . traZODone (DESYREL) tablet 50 mg  50 mg Oral QHS PRN Worthy FlankIjeoma E Nwaeze, NP   50 mg at 03/24/15 0126    Lab Results:  Results for orders placed or performed during the hospital encounter of 03/23/15 (from the past 48 hour(s))  TSH     Status: None   Collection Time: 03/25/15  6:30 AM  Result Value Ref Range   TSH 0.997 0.350 - 4.500 uIU/mL    Comment: [facility]    Physical Findings: AIMS: Facial and Oral Movements Muscles of Facial Expression: None, normal Lips and Perioral Area: None, normal Jaw: None, normal Tongue: None, normal,Extremity Movements Upper (arms, wrists, hands, fingers): None,  normal Lower (legs, knees, ankles, toes): None, normal, Trunk Movements Neck, shoulders, hips: None, normal, Overall Severity Severity of abnormal movements (highest score from questions above): None, normal Incapacitation due to abnormal movements: None, normal Patient's awareness of abnormal movements (rate only patient's report): No Awareness, Dental Status Current problems with teeth and/or dentures?: No Does patient usually wear dentures?: No  CIWA:    COWS:     Treatment Plan Summary: Medication management  Patient remains very grandiose,  delusional with thought blocking. Increase Haldol 5 mg twice a day and Cogentin 0.5 mg twice a day. Continue to monitor efficacy and side effects of medication. Continue trazodone at bedtime and Ativan and Vistaril for anxiety.  I would also continue Haldol injection for severe agitation and aggression.  Encouraged to participate in group milieu therapy. His UDS is negative.  His hemoglobin A1c and TSH is pending.     Medical Decision Making:  Review or order clinical lab tests (1), Review and summation of old records (2), Established Problem, Worsening (2), Review of Last Therapy Session (1), Review of Medication Regimen & Side Effects (2) and Review of New Medication or Change in Dosage (2)     Finneas Mathe T. 03/25/2015, 9:56 AM

## 2015-03-26 DIAGNOSIS — F200 Paranoid schizophrenia: Secondary | ICD-10-CM

## 2015-03-26 NOTE — Progress Notes (Signed)
Patient has been in his room asleep with eyes closed and respirations even and unlabored, no distress noted. Patient apparently is up during day shift and sleeps during night shift because this is the second night patient has been asleep and stays asleep during night shift. Safety maintained on unit with 15 min checks.

## 2015-03-26 NOTE — Plan of Care (Signed)
Problem: Diagnosis: Increased Risk For Suicide Attempt Goal: STG-Patient Will Attend All Groups On The Unit Outcome: Not Progressing Pt did not attend group tonight, he has been in bed asleep, no distress noted.

## 2015-03-26 NOTE — [facility] Group Notes (Signed)
[facility] Group Notes:  (Nursing/MHT/Case Management/Adjunct)  Date:  03/26/2015  Time:  11:02 AM  Type of Therapy:  Psychoeducational Skills  Participation Level:  Active  Participation Quality:  Appropriate  Affect:  Appropriate  Cognitive:  Appropriate  Insight:  Appropriate  Engagement in Group:  Engaged  Modes of Intervention:  Discussion  Summary of Progress/Problems: Pt did attend self inventory group, pt reported that he was negative SI/HI, no AH/VH noted. Pt rated his depression as a 0, and his helplessness/hopelessness as a 0.     Pt reported no issues or concerns.   Jacquelyne BalintForrest, Enriqueta Augusta Shanta 03/26/2015, 11:02 AM

## 2015-03-26 NOTE — Progress Notes (Signed)
Did not attend group 

## 2015-03-26 NOTE — Progress Notes (Addendum)
Pt is in the dayroom watching TV. Pt denies si and hi. Pt denies hallucinations and reports that medication is helping. Later this afternoon, pt was heard in his room laughing and talking. When writer checked on the patient he was in his bed talking and laughing with no one else in the room.  Pt was encouraged to drink fluids and given Gatorade. Safety maintained on the unit.

## 2015-03-26 NOTE — Progress Notes (Signed)
Patient ID: Glenn CageLarry Bolton, male   DOB: [birth year], 24 y.o.   MRN: [id number]   D: Pt continues to be very flat and depressed on the unit. Pt continues to pace the halls talking to himself. Pt reported that he felt much better today, and that he was not as anxious. Pt reported on his self inventory sheet that his depression was a 3, his hopelessness was a 0, and that his anxiety was a 6. Pt reported that his goal for today was to be thankful. Pt reported being negative SI/HI, no AH/VH noted. A: 15 min checks continued for patient safety. R: Pt safety maintained.

## 2015-03-26 NOTE — Progress Notes (Signed)
[facility] MD Progress Note  03/26/2015 11:10 AM Glenn Bolton  MRN:  [id number] Subjective:  I slept good last night.    Objective: Patient seen chart reviewed.  Patient reported improvement in his sleep.  Though he remains very guarded paranoid but is more calm.  He was seen talking to himself laughing giggling .  When confronted he reported that he is listening music in his head.  He did not participate in the groups.  He makes limited eye contact.  He still have some thought blocking but he is not aggressive or agitated.  His appetite is okay.  His vitals are stable.  He is taking his medication and reported no side effects.  His hemoglobin A1c is pending.  His TSH is normal.  His LDL was slightly high.  Principal Problem: Schizophrenia Diagnosis:   Patient Active Problem List   Diagnosis Date Noted  . Tobacco use disorder [Z72.0] 03/24/2015  . Schizophrenia [F20.9] 03/23/2015   Total Time spent with patient: 20 minutes   Past Medical History:  Past Medical History  Diagnosis Date  . Schizophrenia    History reviewed. No pertinent past surgical history. Family History:  Family History  Problem Relation Age of Onset  . Depression Sister   . Diabetes Other    Social History:  History  Alcohol Use  . Yes    Comment: socially     History  Drug Use  . Yes  . Special: Marijuana    History   Social History  . Marital Status: Single    Spouse Name: N/A  . Number of Children: N/A  . Years of Education: N/A   Social History Main Topics  . Smoking status: Never Smoker   . Smokeless tobacco: Not on file  . Alcohol Use: Yes     Comment: socially  . Drug Use: Yes    Special: Marijuana  . Sexual Activity: No   Other Topics Concern  . None   Social History Narrative   Additional History:    Sleep: Poor  Appetite:  Fair   Assessment:   Musculoskeletal: Strength & Muscle Tone: within normal limits Gait & Station: normal Patient leans: N/A   Psychiatric Specialty  Exam: Physical Exam  Review of Systems  HENT: Negative.   Musculoskeletal: Negative.   Skin: Negative.   Neurological: Negative for tremors.  Psychiatric/Behavioral: Positive for hallucinations. The patient is nervous/anxious.     Blood pressure 104/68, pulse 107, temperature 97.6 F (36.4 C), temperature source Oral, resp. rate 16, height  (1.778 m), weight 73.483 kg (162 lb).Body mass index is 23.24 kg/(m^2).  General Appearance: Guarded  Eye Contact::  Minimal  Speech:  Slow  Volume:  Decreased  Mood:  Anxious and Depressed  Affect:  Flat  Thought Process:  Disorganized  Orientation:  Full (Time, Place, and Person)  Thought Content:  Hallucinations: Auditory and Paranoid Ideation  Suicidal Thoughts:  No  Homicidal Thoughts:  No  Memory:  Immediate;   Fair Recent;   Fair Remote;   Fair  Judgement:  Fair  Insight:  Lacking  Psychomotor Activity:  Increased  Concentration:  Poor  Recall:  [employer] of Knowledge:Fair  Language: Fair  Akathisia:  No  Handed:  Right  AIMS (if indicated):     Assets:  Desire for Improvement Housing Social Support  ADL's:  Intact  Cognition: WNL  Sleep:  Number of Hours: 6.5     Current Medications: Current Facility-Administered Medications  Medication Dose Route  Frequency Provider Last Rate Last Dose  . acetaminophen (TYLENOL) tablet 650 mg  650 mg Oral Q6H PRN Worthy Flank, NP      . alum & mag hydroxide-simeth (MAALOX/MYLANTA) 200-200-20 MG/5ML suspension 30 mL  30 mL Oral Q4H PRN Worthy Flank, NP      . benztropine (COGENTIN) tablet 0.5 mg  0.5 mg Oral BID Cleotis Nipper, MD   0.5 mg at 03/26/15 0741  . diphenhydrAMINE (BENADRYL) capsule 25 mg  25 mg Oral Q8H PRN Jomarie Longs, MD       Or  . diphenhydrAMINE (BENADRYL) injection 25 mg  25 mg Intramuscular Q8H PRN Saramma Eappen, MD      . feeding supplement (RESOURCE BREEZE) (RESOURCE BREEZE) liquid 1 Container  1 Container Oral BID BM Renie Ora, RD   1  Container at 03/25/15 [phone]  . haloperidol (HALDOL) tablet 5 mg  5 mg Oral Q8H PRN Jomarie Longs, MD   5 mg at 03/25/15 0841   Or  . haloperidol lactate (HALDOL) injection 5 mg  5 mg Intramuscular Q8H PRN Saramma Eappen, MD      . haloperidol (HALDOL) tablet 5 mg  5 mg Oral BID Cleotis Nipper, MD   5 mg at 03/26/15 0741  . hydrOXYzine (ATARAX/VISTARIL) tablet 25 mg  25 mg Oral TID PRN Worthy Flank, NP   25 mg at 03/25/15 0841  . LORazepam (ATIVAN) tablet 0.5 mg  0.5 mg Oral Q8H PRN Jomarie Longs, MD   0.5 mg at 03/25/15 0841   Or  . LORazepam (ATIVAN) injection 0.5 mg  0.5 mg Intramuscular Q8H PRN Saramma Eappen, MD      . magnesium hydroxide (MILK OF MAGNESIA) suspension 30 mL  30 mL Oral Daily PRN Worthy Flank, NP      . nicotine polacrilex (NICORETTE) gum 2 mg  2 mg Oral PRN Worthy Flank, NP   2 mg at 03/26/15 0741  . traZODone (DESYREL) tablet 50 mg  50 mg Oral QHS PRN Worthy Flank, NP   50 mg at 03/24/15 0126    Lab Results:  Results for orders placed or performed during the hospital encounter of 03/23/15 (from the past 48 hour(s))  TSH     Status: None   Collection Time: 03/25/15  6:30 AM  Result Value Ref Range   TSH 0.997 0.350 - 4.500 uIU/mL    Comment: Performed at [facility]  Lipid panel     Status: Abnormal   Collection Time: 03/25/15  6:30 AM  Result Value Ref Range   Cholesterol 186 0 - 200 mg/dL   Triglycerides 70 <119 mg/dL   HDL 47 >14 mg/dL   Total CHOL/HDL Ratio 4.0 RATIO   VLDL 14 0 - 40 mg/dL   LDL Cholesterol 782 (H) 0 - 99 mg/dL    Comment:        Total Cholesterol/HDL:CHD Risk Coronary Heart Disease Risk Table                     Men   Women  1/2 Average Risk   3.4   3.3  Average Risk       5.0   4.4  2 X Average Risk   9.6   7.1  3 X Average Risk  23.4   11.0        Use the calculated Patient Ratio above and the CHD Risk Table to determine the patient's CHD Risk.  ATP III CLASSIFICATION (LDL):  <100     mg/dL    Optimal  [id number]  mg/dL   Near or Above                    Optimal  130-159  mg/dL   Borderline  [id number]  mg/dL   High  >213>190     mg/dL   Very High Performed at [facility]     Physical Findings: AIMS: Facial and Oral Movements Muscles of Facial Expression: None, normal Lips and Perioral Area: None, normal Jaw: None, normal Tongue: None, normal,Extremity Movements Upper (arms, wrists, hands, fingers): None, normal Lower (legs, knees, ankles, toes): None, normal, Trunk Movements Neck, shoulders, hips: None, normal, Overall Severity Severity of abnormal movements (highest score from questions above): None, normal Incapacitation due to abnormal movements: None, normal Patient's awareness of abnormal movements (rate only patient's report): No Awareness, Dental Status Current problems with teeth and/or dentures?: No Does patient usually wear dentures?: No  CIWA:    COWS:     Treatment Plan Summary: Medication management  Patient is doing better from the past.  He is taking his medication and denies any side effects.  He is better since Haldol dose was increased .  Continue Haldol 0.5 mg twice a day and Cogentin 0.5 mg twice a day. Continue to monitor efficacy and side effects of medication. Continue trazodone at bedtime and Ativan and Vistaril for anxiety.  We'll consider Haldol Decanoate injection upon discharge .  Continue Haldol injection for severe agitation and aggression.  Encouraged to participate in group milieu therapy.  His TSH is normal.  His LDL was slightly high.  His hemoglobin A1c is pending.  Medical Decision Making:  Review or order clinical lab tests (1), Review and summation of old records (2), Established Problem, Worsening (2), Review of Last Therapy Session (1), Review of Medication Regimen & Side Effects (2) and Review of New Medication or Change in Dosage (2)     Karthik Whittinghill T. 03/26/2015, 11:10 AM

## 2015-03-26 NOTE — Progress Notes (Signed)
Writer entered patients room and observed him lying in bed and appeared to be asleep but answered when writer called his name. Writer asked if he wanted a snack and he reports that he is fine and wants to rest. He requested no medications and writer closed door to allow him to rest. It was reported that patient has been up during the day rapping and in his room laughing and talking to himself. This was not observed during our brief conversation. Safety maintained on unit with 15 min checks.

## 2015-03-26 NOTE — [facility] Group Notes (Signed)
[facility] Group Notes: (Clinical Social Work)   03/26/2015      Type of Therapy:  Group Therapy   Participation Level:  Did Not Attend despite MHT prompting   Derward Marple Grossman-Orr, LCSW 03/26/2015, 12:25 PM     

## 2015-03-27 LAB — HEMOGLOBIN A1C
Hgb A1c MFr Bld: 5.3 % (ref 4.8–5.6)
Mean Plasma Glucose: 105 mg/dL

## 2015-03-27 MED ORDER — BENZTROPINE MESYLATE 0.5 MG PO TABS
0.5000 mg | ORAL_TABLET | Freq: Every day | ORAL | Status: DC
  Administered 2015-03-27 – 2015-03-30 (×4): 0.5 mg via ORAL
  Filled 2015-03-27 (×5): qty 1

## 2015-03-27 MED ORDER — HALOPERIDOL 5 MG PO TABS
5.0000 mg | ORAL_TABLET | Freq: Every day | ORAL | Status: DC
  Administered 2015-03-28 – 2015-03-31 (×4): 5 mg via ORAL
  Filled 2015-03-27: qty 14
  Filled 2015-03-27 (×5): qty 1

## 2015-03-27 MED ORDER — CITALOPRAM HYDROBROMIDE 10 MG PO TABS
10.0000 mg | ORAL_TABLET | Freq: Every day | ORAL | Status: DC
  Administered 2015-03-27 – 2015-03-29 (×3): 10 mg via ORAL
  Filled 2015-03-27 (×5): qty 1

## 2015-03-27 MED ORDER — HALOPERIDOL 5 MG PO TABS
10.0000 mg | ORAL_TABLET | Freq: Every day | ORAL | Status: DC
  Administered 2015-03-27: 10 mg via ORAL
  Filled 2015-03-27 (×2): qty 2

## 2015-03-27 MED ORDER — BENZTROPINE MESYLATE 0.5 MG PO TABS
0.5000 mg | ORAL_TABLET | Freq: Every day | ORAL | Status: DC
  Administered 2015-03-28 – 2015-03-31 (×4): 0.5 mg via ORAL
  Filled 2015-03-27 (×4): qty 1
  Filled 2015-03-27: qty 28
  Filled 2015-03-27: qty 1

## 2015-03-27 NOTE — Progress Notes (Signed)
D: Patient is alert and oriented. Pt's mood and affect is pleasant upon interaction and blunted. Pt denies SI/HI and AVH, however RN observed pt pacing in room and speaking to self, pt appears preoccupied, pt laughs at inappropriate times. Pt is isolative and minimally responds to RN. Pt rates depression, hopelessness, and anxiety 0/10. Pt reports his goal for the day is to "stay calm" by "try to ignore the stress." Pt experiencing HTN this morning, denies symptoms (See docflowsheet-vitals). Pt complains of nicotine craving today, PRN medication effective for pt. A: MD Eappen made aware of pt's HTN, will reassess. Active listening by RN. Encouragement/Support provided to pt. Medication education reviewed with pt. PRN medication administered for nicotine craving/smoking cessation per providers orders (See MAR). Scheduled medications administered per providers orders (See MAR). 15 minute checks continued per protocol for patient safety.  R: Patient cooperative and receptive to nursing interventions. Pt remains safe.

## 2015-03-27 NOTE — [facility] Group Notes (Signed)
[facility] Note   03/27/2015 10:06 AM  Participation Quality:  Invited.  Declined to attend   Glenn Bolton, Glenn Bolton

## 2015-03-27 NOTE — Progress Notes (Signed)
[facility]  03/27/2015 2:43 PM Glenn Bolton  MRN:  [id number] Subjective:  I still hear voices , they are talking through me , they never go away, I have had it since the past 3 years , they do become less bothersome sometimes , but I still hear them now. I also feel depressed.'    Objective: Patient seen chart reviewed. Patient has a hx of schizophrenia ,presented with delusions as well as AH of being connected to as well as being able to communicate with rappers.  Pt this AM ,continues to be delusional, continues to endorse ability to talk to rappers . Pt continues to be depressed .  Pt denies SI/HI. Pt encouraged to participate in group activities. Discussed medication changes to target his sx. Pt agrees with plan.   Principal Problem: Schizophrenia Diagnosis:   Patient Active Problem List   Diagnosis Date Noted  . Paranoid schizophrenia [F20.0]   . Tobacco use disorder [Z72.0] 03/24/2015  . Schizophrenia [F20.9] 03/23/2015   Total Time spent with patient: 30 minutes   Past Medical History:  Past Medical History  Diagnosis Date  . Schizophrenia    History reviewed. No pertinent past surgical history. Family History:  Family History  Problem Relation Age of Onset  . Depression Sister   . Diabetes Other    Social History:  History  Alcohol Use  . Yes    Comment: socially     History  Drug Use  . Yes  . Special: Marijuana    History   Social History  . Marital Status: Single    Spouse Name: N/A  . Number of Children: N/A  . Years of Education: N/A   Social History Main Topics  . Smoking status: Never Smoker   . Smokeless tobacco: Not on file  . Alcohol Use: Yes     Comment: socially  . Drug Use: Yes    Special: Marijuana  . Sexual Activity: No   Other Topics Concern  . None   Social History Narrative   Additional History:    Sleep: Fair  Appetite:  Fair     Musculoskeletal: Strength & Muscle Tone: within normal limits Gait &  Station: normal Patient leans: N/A   Psychiatric Specialty Exam: Physical Exam  Review of Systems  Psychiatric/Behavioral: Positive for depression and hallucinations. The patient is nervous/anxious.   All other systems reviewed and are negative.   Blood pressure 106/65, pulse 91, temperature 97.7 F (36.5 C), temperature source Oral, resp. rate 20, height 5\' 10"  (1.778 m), weight 73.483 kg (162 lb).Body mass index is 23.24 kg/(m^2).  General Appearance: Guarded  Eye Contact::  Fair  Speech:  Normal Rate  Volume:  Decreased  Mood:  Anxious and Depressed  Affect:  Flat  Thought Process:  Disorganized  Orientation:  Full (Time, Place, and Person)  Thought Content:  Delusions and Hallucinations: Auditory  Suicidal Thoughts:  No  Homicidal Thoughts:  No  Memory:  Immediate;   Fair Recent;   Fair Remote;   Fair  Judgement:  Fair  Insight:  Lacking  Psychomotor Activity:  Increased  Concentration:  Poor  Recall:  [employer]  Fund of Knowledge:Fair  Language: Fair  Akathisia:  No  Handed:  Right  AIMS (if indicated):     Assets:  Desire for Improvement Housing Social Support  ADL's:  Intact  Cognition: WNL  Sleep:  Number of Hours: 6.5     Current Medications: Current Facility-Administered Medications  Medication Dose Route Frequency  Provider Last Rate Last Dose  . acetaminophen (TYLENOL) tablet 650 mg  650 mg Oral Q6H PRN Worthy Flank, NP      . alum & mag hydroxide-simeth (MAALOX/MYLANTA) 200-200-20 MG/5ML suspension 30 mL  30 mL Oral Q4H PRN Worthy Flank, NP      . Melene Muller ON 03/28/2015] benztropine (COGENTIN) tablet 0.5 mg  0.5 mg Oral QPC breakfast Asheley Hellberg, MD      . benztropine (COGENTIN) tablet 0.5 mg  0.5 mg Oral QPC supper Jomarie Longs, MD      . citalopram (CELEXA) tablet 10 mg  10 mg Oral Daily Kanyon Bunn, MD   10 mg at 03/27/15 1101  . diphenhydrAMINE (BENADRYL) capsule 25 mg  25 mg Oral Q8H PRN Jomarie Longs, MD       Or  . diphenhydrAMINE  (BENADRYL) injection 25 mg  25 mg Intramuscular Q8H PRN Damiean Lukes, MD      . feeding supplement (RESOURCE BREEZE) (RESOURCE BREEZE) liquid 1 Container  1 Container Oral BID BM Renie Ora, RD   1 Container at 03/27/15 [phone]  . haloperidol (HALDOL) tablet 10 mg  10 mg Oral QPC supper Jomarie Longs, MD      . haloperidol (HALDOL) tablet 5 mg  5 mg Oral Q8H PRN Jomarie Longs, MD   5 mg at 03/25/15 0841   Or  . haloperidol lactate (HALDOL) injection 5 mg  5 mg Intramuscular Q8H PRN Jomarie Longs, MD      . Melene Muller ON 03/28/2015] haloperidol (HALDOL) tablet 5 mg  5 mg Oral QPC breakfast Manasvini Whatley, MD      . hydrOXYzine (ATARAX/VISTARIL) tablet 25 mg  25 mg Oral TID PRN Worthy Flank, NP   25 mg at 03/25/15 0841  . LORazepam (ATIVAN) tablet 0.5 mg  0.5 mg Oral Q8H PRN Jomarie Longs, MD   0.5 mg at 03/25/15 0841   Or  . LORazepam (ATIVAN) injection 0.5 mg  0.5 mg Intramuscular Q8H PRN Mikyle Sox, MD      . magnesium hydroxide (MILK OF MAGNESIA) suspension 30 mL  30 mL Oral Daily PRN Worthy Flank, NP      . nicotine polacrilex (NICORETTE) gum 2 mg  2 mg Oral PRN Worthy Flank, NP   2 mg at 03/27/15 1028  . traZODone (DESYREL) tablet 50 mg  50 mg Oral QHS PRN Worthy Flank, NP   50 mg at 03/24/15 0126    Lab Results:  No results found for this or any previous visit (from the past 48 hour(s)).  Physical Findings: AIMS: Facial and Oral Movements Muscles of Facial Expression: None, normal Lips and Perioral Area: None, normal Jaw: None, normal Tongue: None, normal,Extremity Movements Upper (arms, wrists, hands, fingers): None, normal Lower (legs, knees, ankles, toes): None, normal, Trunk Movements Neck, shoulders, hips: None, normal, Overall Severity Severity of abnormal movements (highest score from questions above): None, normal Incapacitation due to abnormal movements: None, normal Patient's awareness of abnormal movements (rate only patient's report): No Awareness,  Dental Status Current problems with teeth and/or dentures?: No Does patient usually wear dentures?: No  CIWA:    COWS:     Assessment: Patient is a 24 year old AAM who presented with schizophrenia , presented as very delusional as well as with AH of rappers. Pt continues to be psychotic as well as depressed. Will continue treatment.  Treatment Plan Summary: Medication management   Increase Haldol to 15 mg po daily in two divided  doses for psychosis. Plan to DC patient on Haldol decanoate IM if patient is agreeable. Will continue Cogentin 0.5 mg po bid for EPS. Will add Celexa 10 mg po daily for depressive sx. Will continue Trazodone 50 mg po qhs for sleep. Continue Haldol 5 mg po/IM prn ,Benadryl 25 mg PO/IM prn, ativan 0.5 mg po/im prn for agitation/anxiety sx. Continue Nicotine 2 mg gum prn for smoking cessation. Pt encouraged to attend groups. CSW will work on disposition.    Medical Decision Making:  Review or order clinical lab tests (1), Review and summation of old records (2), Established Problem, Worsening (2), Review of Last Therapy Session (1), Review of Medication Regimen & Side Effects (2) and Review of New Medication or Change in Dosage (2)     Houston Surges MD 03/27/2015, 2:43 PM

## 2015-03-27 NOTE — Plan of Care (Signed)
Problem: Alteration in thought process Goal: LTG-Patient behavior demonstrates decreased signs psychosis Ridwan is c/o AH that makes it impossible for him to focus. Goal not met. R North LCSW 03/24/2015 11:32 AM  Outcome: Not Progressing Patient remained isolative to his room and reports positive for auditory hallucinations.

## 2015-03-27 NOTE — Plan of Care (Signed)
Problem: Ineffective individual coping Goal: STG: Patient will remain free from self harm Outcome: Progressing Patient remains free from self harm. 15 minute checks continued per protocol for patient safety.   Problem: Alteration in thought process Goal: STG-Patient is able to discuss thoughts with staff Outcome: Not Progressing Patient minimally engages with RN throughout the day. Pt remains isolative in bed.  Problem: Alteration in mood Goal: LTG-Patient reports reduction in suicidal thoughts (Patient reports reduction in suicidal thoughts and is able to verbalize a safety plan for whenever patient is feeling suicidal)  Outcome: Progressing Patient denies having any suicidal thoughts today.  Problem: Diagnosis: Increased Risk For Suicide Attempt Goal: STG-Patient Will Comply With Medication Regime Outcome: Progressing Patient has adhered to medication regimen today with ease.

## 2015-03-28 MED ORDER — HALOPERIDOL 5 MG PO TABS
15.0000 mg | ORAL_TABLET | Freq: Every day | ORAL | Status: DC
  Administered 2015-03-28: 15 mg via ORAL
  Filled 2015-03-28 (×2): qty 3

## 2015-03-28 NOTE — Plan of Care (Signed)
Problem: Ineffective individual coping Goal: STG: Patient will remain free from self harm Outcome: Progressing Patient remains free from self harm. 15 minute checks continued per protocol for patient safety.   Problem: Alteration in mood Goal: LTG-Patient reports reduction in suicidal thoughts (Patient reports reduction in suicidal thoughts and is able to verbalize a safety plan for whenever patient is feeling suicidal)  Outcome: Progressing Patient denies having any suicidal thoughts today.  Problem: Diagnosis: Increased Risk For Suicide Attempt Goal: STG-Patient Will Attend All Groups On The Unit Outcome: Progressing Patient is attending unit groups today. Goal: STG-Patient Will Comply With Medication Regime Outcome: Progressing Patient has adhered to medication regimen today with ease.     

## 2015-03-28 NOTE — Progress Notes (Signed)
Nutrition Education Note  RD consulted for nutrition education regarding a Heart Healthy diet.   Lipid Panel     Component Value Date/Time   CHOL 186 03/25/2015 0630   TRIG 70 03/25/2015 0630   HDL 47 03/25/2015 0630   CHOLHDL 4.0 03/25/2015 0630   VLDL 14 03/25/2015 0630   LDLCALC 125* 03/25/2015 0630    RD provided "Heart Healthy Nutrition Therapy" handout from the Academy of [employer]. Reviewed patient's dietary recall. Provided examples on ways to decrease sodium and fat intake in diet. Discouraged intake of processed foods and use of salt shaker. Encouraged fresh fruits and vegetables as well as whole grain sources of carbohydrates to maximize fiber intake. Teach back method used.  Talked with pt who reports his appetite has been good the past few days. He is appreciative of d/c of Ensure Enlive after last discussion.  Expect fair compliance.  Body mass index is 23.24 kg/(m^2). Pt meets criteria for normal weight status based on current BMI.  Current diet order is Regular, patient is eating meals and snacks ad lib. Labs and medications reviewed. No further nutrition interventions warranted at this time. RD contact information provided. If additional nutrition issues arise, please re-consult RD.   Trenton GammonJessica Dalyah Pla, RD, LDN Inpatient Clinical Dietitian Pager # [phone] After hours/weekend pager # [phone]

## 2015-03-28 NOTE — [facility] Group Notes (Signed)
[facility] Group Notes:  (Nursing/MHT/Case Management/Adjunct)  Date:  03/28/2015  Time:  0915am  Type of Therapy:  Nurse Education  Participation Level:  Active  Participation Quality:  Appropriate and Attentive  Affect:  Flat  Cognitive:  Alert and Appropriate  Insight:  Appropriate and Good  Engagement in Group:  Engaged  Modes of Intervention:  Discussion, Education and Support  Summary of Progress/Problems: Patient attended group, remained engaged, and responded appropriately when prompted.  Lendell CapriceGuthrie, Paulo Keimig A 03/28/2015, 10:20 AM

## 2015-03-28 NOTE — [facility] Group Notes (Signed)
[facility] LCSW Group Therapy  03/28/2015 , 1:25 PM   Type of Therapy:  Group Therapy  Participation Level:  Active  Participation Quality:  Attentive  Affect:  Appropriate  Cognitive:  Alert  Insight:  Improving  Engagement in Therapy:  Engaged  Modes of Intervention:  Discussion, Exploration and Socialization  Summary of Progress/Problems: Today's group focused on the term Diagnosis.  Participants were asked to define the term, and then pronounce whether it is a negative, positive or neutral term.  Peyton NajjarLarry reluctantly came to group; stayed the entire time.  Quiet.  Stated he has been diagnosed with schizophrenia, and it is has been helpful because he has been provided with treatment for same.  States he gets relief from voices when playing basketball because he becomes "distracted.  They don't go away, but I don't answer back when I'm playing."  [name], Robinson Brinkley B 03/28/2015 , 1:25 PM

## 2015-03-28 NOTE — Progress Notes (Signed)
D: Patient is alert and oriented. Pt's mood and affect is pleasant upon interaction and flat. Pt denies SI/HI and AVH. Pt is observed pacing the halls and paces in his room. Pt is observed speaking to himself in his room. Pt verbalizes delusions to RN. RN asked pt whom he speaks to when he is alone in his room, pt states "rapers." RN asked pt if he see's or hears rapers and he said "No, no it's not like that, they talk through me." Pt rates depression, hopelessness, and anxiety 0/10. Pt reports his goal for the day is "have a good day" by "stay humble." Pt complains of nicotine craving throughout the day. Pt is attending unit groups. A: Active listening by RN. Encouragement/Support provided to pt. Pt encouraged to attend groups today. Pt encouraged to increase fluids. Medication education reviewed with pt. PRN medication administered for smoking cessation per providers orders (See MAR). Scheduled medications administered per providers orders (See MAR). 15 minute checks continued per protocol for patient safety.  R: Patient cooperative and receptive to nursing interventions. Pt remains safe.

## 2015-03-28 NOTE — Progress Notes (Signed)
[facility]  03/28/2015 2:07 PM Glenn CageLarry Bolton  MRN:  [id number] Subjective:  I hear voices, they are talking through me , and it is frustrating.'    Objective: Patient seen chart reviewed. Patient has a hx of schizophrenia ,presented with delusions as well as AH of being connected to as well as being able to communicate with rappers.  Pt this AM ,continues to be delusional, continues to have AH - which he describes as "they are talking through me'. Pt denies SI/HI. Pt encouraged to participate in group activities.  Per staff pt continues to be seen as responding to internal stimuli, talking to self. Discussed medication changes to target his sx. Pt agrees with plan.Pt denies any ADRs of medications.   Principal Problem: Schizophrenia Diagnosis:   Patient Active Problem List   Diagnosis Date Noted  . Tobacco use disorder [Z72.0] 03/24/2015  . Schizophrenia [F20.9] 03/23/2015   Total Time spent with patient: 30 minutes   Past Medical History:  Past Medical History  Diagnosis Date  . Schizophrenia    History reviewed. No pertinent past surgical history. Family History:  Family History  Problem Relation Age of Onset  . Depression Sister   . Diabetes Other    Social History:  History  Alcohol Use  . Yes    Comment: socially     History  Drug Use  . Yes  . Special: Marijuana    History   Social History  . Marital Status: Single    Spouse Name: N/A  . Number of Children: N/A  . Years of Education: N/A   Social History Main Topics  . Smoking status: Never Smoker   . Smokeless tobacco: Not on file  . Alcohol Use: Yes     Comment: socially  . Drug Use: Yes    Special: Marijuana  . Sexual Activity: No   Other Topics Concern  . None   Social History Narrative   Additional History:    Sleep: Fair  Appetite:  Fair     Musculoskeletal: Strength & Muscle Tone: within normal limits Gait & Station: normal Patient leans: N/A   Psychiatric  Specialty Exam: Physical Exam  Review of Systems  Psychiatric/Behavioral: Positive for hallucinations.  All other systems reviewed and are negative.   Blood pressure 115/62, pulse 82, temperature 97.6 F (36.4 C), temperature source Oral, resp. rate 16, height 5\' 10"  (1.778 m), weight 73.483 kg (162 lb).Body mass index is 23.24 kg/(m^2).  General Appearance: Guarded  Eye Contact::  Fair  Speech:  Normal Rate  Volume:  Decreased  Mood:  Anxious and Depressed improving  Affect:  Flat  Thought Process:  Disorganized  Orientation:  Full (Time, Place, and Person)  Thought Content:  Delusions and Hallucinations: Auditory  Suicidal Thoughts:  No  Homicidal Thoughts:  No  Memory:  Immediate;   Fair Recent;   Fair Remote;   Fair  Judgement:  Fair  Insight:  Lacking  Psychomotor Activity:  Increased  Concentration:  Poor  Recall:  [employer]Fair  Fund of Knowledge:Fair  Language: Fair  Akathisia:  No  Handed:  Right  AIMS (if indicated):     Assets:  Desire for Improvement Housing Social Support  ADL's:  Intact  Cognition: WNL  Sleep:  Number of Hours: 6.75     Current Medications: Current Facility-Administered Medications  Medication Dose Route Frequency Provider Last Rate Last Dose  . acetaminophen (TYLENOL) tablet 650 mg  650 mg Oral Q6H PRN Worthy FlankIjeoma E Nwaeze, NP      .  alum & mag hydroxide-simeth (MAALOX/MYLANTA) 200-200-20 MG/5ML suspension 30 mL  30 mL Oral Q4H PRN Worthy Flank, NP      . benztropine (COGENTIN) tablet 0.5 mg  0.5 mg Oral QPC breakfast Jomarie Longs, MD   0.5 mg at 03/28/15 0914  . benztropine (COGENTIN) tablet 0.5 mg  0.5 mg Oral QPC supper Jomarie Longs, MD   0.5 mg at 03/27/15 1807  . citalopram (CELEXA) tablet 10 mg  10 mg Oral Daily Jomarie Longs, MD   10 mg at 03/28/15 0740  . diphenhydrAMINE (BENADRYL) capsule 25 mg  25 mg Oral Q8H PRN Jomarie Longs, MD       Or  . diphenhydrAMINE (BENADRYL) injection 25 mg  25 mg Intramuscular Q8H PRN Naveen Lorusso,  MD      . feeding supplement (RESOURCE BREEZE) (RESOURCE BREEZE) liquid 1 Container  1 Container Oral BID BM Renie Ora, RD   1 Container at 03/27/15 [phone]  . haloperidol (HALDOL) tablet 15 mg  15 mg Oral QPC supper Jomarie Longs, MD      . haloperidol (HALDOL) tablet 5 mg  5 mg Oral Q8H PRN Jomarie Longs, MD   5 mg at 03/25/15 0841   Or  . haloperidol lactate (HALDOL) injection 5 mg  5 mg Intramuscular Q8H PRN Loni Abdon, MD      . haloperidol (HALDOL) tablet 5 mg  5 mg Oral QPC breakfast Raquel Sayres, MD   5 mg at 03/28/15 0914  . hydrOXYzine (ATARAX/VISTARIL) tablet 25 mg  25 mg Oral TID PRN Worthy Flank, NP   25 mg at 03/25/15 0841  . LORazepam (ATIVAN) tablet 0.5 mg  0.5 mg Oral Q8H PRN Jomarie Longs, MD   0.5 mg at 03/25/15 0841   Or  . LORazepam (ATIVAN) injection 0.5 mg  0.5 mg Intramuscular Q8H PRN Irving Lubbers, MD      . magnesium hydroxide (MILK OF MAGNESIA) suspension 30 mL  30 mL Oral Daily PRN Worthy Flank, NP      . nicotine polacrilex (NICORETTE) gum 2 mg  2 mg Oral PRN Worthy Flank, NP   2 mg at 03/28/15 1005  . traZODone (DESYREL) tablet 50 mg  50 mg Oral QHS PRN Worthy Flank, NP   50 mg at 03/27/15 2130    Lab Results:  No results found for this or any previous visit (from the past 48 hour(s)).  Physical Findings: AIMS: Facial and Oral Movements Muscles of Facial Expression: None, normal Lips and Perioral Area: None, normal Jaw: None, normal Tongue: None, normal,Extremity Movements Upper (arms, wrists, hands, fingers): None, normal Lower (legs, knees, ankles, toes): None, normal, Trunk Movements Neck, shoulders, hips: None, normal, Overall Severity Severity of abnormal movements (highest score from questions above): None, normal Incapacitation due to abnormal movements: None, normal Patient's awareness of abnormal movements (rate only patient's report): No Awareness, Dental Status Current problems with teeth and/or dentures?: No Does  patient usually wear dentures?: No  CIWA:    COWS:     Assessment: Patient is a 24 year old AAM who presented with schizophrenia , presented as very delusional as well as with AH of rappers. Pt continues to be psychotic as well as depressed. Will continue treatment.  Treatment Plan Summary: Medication management   Increase Haldol to 20 mg po daily in two divided doses for psychosis. Plan to DC patient on Haldol decanoate IM if patient is agreeable. Will continue Cogentin 0.5 mg po bid for EPS. Will  continue Celexa 10 mg po daily for depressive sx. Will continue Trazodone 50 mg po qhs for sleep. Continue Haldol 5 mg po/IM prn ,Benadryl 25 mg PO/IM prn, ativan 0.5 mg po/im prn for agitation/anxiety sx. Continue Nicotine 2 mg gum prn for smoking cessation. Pt encouraged to attend groups. CSW will work on disposition.    Medical Decision Making:  Review of Last Therapy Session (1), Review of Medication Regimen & Side Effects (2) and Review of New Medication or Change in Dosage (2)     Layaan Mott MD 03/28/2015, 2:07 PM

## 2015-03-28 NOTE — Progress Notes (Signed)
Patient in bed for most part of the evening. Writer went to patient room and encouraged him to come to the window for his medications. Patient appeared blunted but answered his questions appropriately. He was given Trazodone for sleep. Patient stated; "this all I get?". He denied SI/HI and denied Hallucinations. Patient received his bedtime medication and went back to his room. Q 15 minute check continues as  ordered to maintain safety.

## 2015-03-29 MED ORDER — HALOPERIDOL 5 MG PO TABS
20.0000 mg | ORAL_TABLET | Freq: Every day | ORAL | Status: DC
  Administered 2015-03-29 – 2015-03-30 (×2): 20 mg via ORAL
  Filled 2015-03-29 (×2): qty 4
  Filled 2015-03-29: qty 56
  Filled 2015-03-29: qty 4

## 2015-03-29 MED ORDER — CITALOPRAM HYDROBROMIDE 20 MG PO TABS
20.0000 mg | ORAL_TABLET | Freq: Every day | ORAL | Status: DC
  Administered 2015-03-30 – 2015-03-31 (×2): 20 mg via ORAL
  Filled 2015-03-29: qty 1
  Filled 2015-03-29: qty 14
  Filled 2015-03-29 (×2): qty 1

## 2015-03-29 MED ORDER — OLANZAPINE 10 MG PO TBDP: 10.0000 mg | ORAL_TABLET | Freq: Three times a day (TID) | ORAL | Status: DC | PRN

## 2015-03-29 NOTE — [facility] Group Notes (Signed)
[facility]   03/29/2015 11:21 AM  Participation Quality:  Minimal  Mood/Affect:  Flat  Depression Rating:  denies  Anxiety Rating:  denies  Thoughts of Suicide:  No Will you contract for safety?   NA  Current AVH:  Yes  Plan for Discharge/Comments:  Glenn Bolton stayed for the entire time.  Excited to relate that his mother has minutes again, and they talked last night.  She wants me to call.  The voices are diminishing in intensity and volume.  Transportation Means: mother or cab  Supports: mother  Glenn Bolton, Glenn Bolton

## 2015-03-29 NOTE — Progress Notes (Signed)
[facility] MD Progress Note  03/29/2015 12:32 PM Glenn Bolton  MRN:  [id number] Subjective: " My voices are getting lower >'    Objective: Patient seen chart reviewed. Patient has a hx of schizophrenia ,presented with delusions as well as AH of being connected to as well as being able to communicate with rappers.   Pt this AM appears to be calmer , more organized. Pt states his AH is lower than before. Pt believes his medications ae working . Pt has been tolerating them well. Pt denies any ADRs of medications. Pt denies SI/HI. Pt encouraged to participate in group activities. Pt declines LAI Haldol decanoate - wants to stay on the PO. Pt plans to return home with his mother - states his mother's phone is switched back on, CSW will attempt to contact mother.   Per staff pt denied any psychosis today , has been seen as pacing the hallways , talks to self. Pt has been compliant on his medications.     Principal Problem: Schizophrenia Diagnosis:   Patient Active Problem List   Diagnosis Date Noted  . Tobacco use disorder [Z72.0] 03/24/2015  . Schizophrenia [F20.9] 03/23/2015   Total Time spent with patient: 30 minutes   Past Medical History:  Past Medical History  Diagnosis Date  . Schizophrenia    History reviewed. No pertinent past surgical history. Family History:  Family History  Problem Relation Age of Onset  . Depression Sister   . Diabetes Other    Social History:  History  Alcohol Use  . Yes    Comment: socially     History  Drug Use  . Yes  . Special: Marijuana    History   Social History  . Marital Status: Single    Spouse Name: N/A  . Number of Children: N/A  . Years of Education: N/A   Social History Main Topics  . Smoking status: Never Smoker   . Smokeless tobacco: Not on file  . Alcohol Use: Yes     Comment: socially  . Drug Use: Yes    Special: Marijuana  . Sexual Activity: No   Other Topics Concern  . None   Social History Narrative    Additional History:    Sleep: Fair  Appetite:  Fair     Musculoskeletal: Strength & Muscle Tone: within normal limits Gait & Station: normal Patient leans: N/A   Psychiatric Specialty Exam: Physical Exam  Review of Systems  Psychiatric/Behavioral: Positive for hallucinations.  All other systems reviewed and are negative.   Blood pressure 91/56, pulse 110, temperature 97.8 F (36.6 C), temperature source Oral, resp. rate 16, height  (1.778 m), weight 73.483 kg (162 lb).Body mass index is 23.24 kg/(m^2).  General Appearance: Guarded  Eye Contact::  Fair  Speech:  Normal Rate  Volume:  Decreased  Mood:  Anxious and Depressed improving  Affect:  Flat  Thought Process:  Disorganized improving  Orientation:  Full (Time, Place, and Person)  Thought Content:  Delusions and Hallucinations: Auditory improving  Suicidal Thoughts:  No  Homicidal Thoughts:  No  Memory:  Immediate;   Fair Recent;   Fair Remote;   Fair  Judgement:  Fair  Insight:  Lacking  Psychomotor Activity:  Increased restless  Concentration:  Poor  Recall:  [employer] of Knowledge:Fair  Language: Fair  Akathisia:  No  Handed:  Right  AIMS (if indicated):     Assets:  Desire for Improvement Housing Social Support  ADL's:  Intact  Cognition: WNL  Sleep:  Number of Hours: 6.75     Current Medications: Current Facility-Administered Medications  Medication Dose Route Frequency Provider Last Rate Last Dose  . acetaminophen (TYLENOL) tablet 650 mg  650 mg Oral Q6H PRN Worthy FlankIjeoma E Nwaeze, NP      . alum & mag hydroxide-simeth (MAALOX/MYLANTA) 200-200-20 MG/5ML suspension 30 mL  30 mL Oral Q4H PRN Worthy FlankIjeoma E Nwaeze, NP      . benztropine (COGENTIN) tablet 0.5 mg  0.5 mg Oral QPC breakfast Chrislynn Mosely, MD   0.5 mg at 03/29/15 0800  . benztropine (COGENTIN) tablet 0.5 mg  0.5 mg Oral QPC supper Jomarie LongsSaramma Shatora Weatherbee, MD   0.5 mg at 03/28/15 1738  . [START ON 03/30/2015] citalopram (CELEXA) tablet 20 mg  20  mg Oral Daily Horace Wishon, MD      . diphenhydrAMINE (BENADRYL) capsule 25 mg  25 mg Oral Q8H PRN Jomarie LongsSaramma Damisha Wolff, MD       Or  . diphenhydrAMINE (BENADRYL) injection 25 mg  25 mg Intramuscular Q8H PRN Christine Schiefelbein, MD      . feeding supplement (RESOURCE BREEZE) (RESOURCE BREEZE) liquid 1 Container  1 Container Oral BID BM Renie OraJessica M Ostheim, RD   1 Container at 03/27/15 [phone]  . haloperidol (HALDOL) tablet 20 mg  20 mg Oral QPC supper Jomarie LongsSaramma Breyden Jeudy, MD      . haloperidol (HALDOL) tablet 5 mg  5 mg Oral Q8H PRN Jomarie LongsSaramma Kortne All, MD   5 mg at 03/25/15 0841   Or  . haloperidol lactate (HALDOL) injection 5 mg  5 mg Intramuscular Q8H PRN Marquita Lias, MD      . haloperidol (HALDOL) tablet 5 mg  5 mg Oral QPC breakfast Danae Oland, MD   5 mg at 03/29/15 0759  . hydrOXYzine (ATARAX/VISTARIL) tablet 25 mg  25 mg Oral TID PRN Worthy FlankIjeoma E Nwaeze, NP   25 mg at 03/25/15 0841  . LORazepam (ATIVAN) tablet 0.5 mg  0.5 mg Oral Q8H PRN Jomarie LongsSaramma Shamecca Whitebread, MD   0.5 mg at 03/25/15 0841   Or  . LORazepam (ATIVAN) injection 0.5 mg  0.5 mg Intramuscular Q8H PRN Moneka Mcquinn, MD      . magnesium hydroxide (MILK OF MAGNESIA) suspension 30 mL  30 mL Oral Daily PRN Worthy FlankIjeoma E Nwaeze, NP      . nicotine polacrilex (NICORETTE) gum 2 mg  2 mg Oral PRN Worthy FlankIjeoma E Nwaeze, NP   2 mg at 03/29/15 0640  . traZODone (DESYREL) tablet 50 mg  50 mg Oral QHS PRN Worthy FlankIjeoma E Nwaeze, NP   50 mg at 03/27/15 2130    Lab Results:  No results found for this or any previous visit (from the past 48 hour(s)).  Physical Findings: AIMS: Facial and Oral Movements Muscles of Facial Expression: None, normal Lips and Perioral Area: None, normal Jaw: None, normal Tongue: None, normal,Extremity Movements Upper (arms, wrists, hands, fingers): None, normal Lower (legs, knees, ankles, toes): None, normal, Trunk Movements Neck, shoulders, hips: None, normal, Overall Severity Severity of abnormal movements (highest score from questions above): None,  normal Incapacitation due to abnormal movements: None, normal Patient's awareness of abnormal movements (rate only patient's report): No Awareness, Dental Status Current problems with teeth and/or dentures?: No Does patient usually wear dentures?: No  CIWA:    COWS:     Assessment: Patient is a [age] year old AAM who presented with schizophrenia , presented as very delusional as well as with AH of rappers. Pt continues to be  psychotic , with some improvement . Will continue treatment.  Treatment Plan Summary: Medication management   Increase Haldol to 25 mg po daily in two divided doses for psychosis. Discussed LAI with patient - Haldol decanoate - pt declined. Will continue Cogentin 0.5 mg po bid for EPS. Will increase Celexa to 20 mg po daily for depressive sx. Will continue Trazodone 50 mg po qhs for sleep. Continue Haldol 5 mg po/IM prn ,Benadryl 25 mg PO/IM prn, ativan 0.5 mg po/im prn for agitation/anxiety sx. Continue Nicotine 2 mg gum prn for smoking cessation. Pt encouraged to attend groups. CSW will work on disposition.    Medical Decision Making:  Review of Psycho-Social Stressors (1), Review of Last Therapy Session (1), Review of Medication Regimen & Side Effects (2) and Review of New Medication or Change in Dosage (2)     Chadric Kimberley MD 03/29/2015, 12:32 PM

## 2015-03-29 NOTE — Progress Notes (Signed)
D. Pt had been in bed for much of the evening, pt did respond to verbal stimuli and when asked questions pt simply stated that he was ok. Pt has not appeared to be in any acute distress this evening and did not verbalize any complaints of pain. A. Support provided. R. Safety maintained, will continue to monitor.

## 2015-03-29 NOTE — Tx Team (Signed)
Interdisciplinary Treatment Plan Update (Adult)  Date:  03/29/2015   Time Reviewed:  11:17 AM   Progress in Treatment: Attending groups: Yes. Participating in groups:  Yes. Taking medication as prescribed:  Yes. Tolerating medication:  Yes. Family/Significant othe contact made:  Yes Patient understands diagnosis:  Yes   Discussing patient identified problems/goals with staff:  Yes, see initial care plan. Medical problems stabilized or resolved:  Yes. Denies suicidal/homicidal ideation: Yes. Issues/concerns per patient self-inventory:  No. Other:  New problem(s) identified:  Discharge Plan or Barriers:  Return home, follow up outpt  Reason for Continuation of Hospitalization:   Comments:    Estimated length of stay:  Likely d/c tomorrow  New goal(s):  Review of initial/current patient goals per problem list:     Attendees: Patient:  03/29/2015 11:17 AM   Family:   03/29/2015 11:17 AM   Physician:  Jomarie LongsSaramma Eappen, MD 03/29/2015 11:17 AM   Nursing:   Marzetta Boardhrista Dopson, RN 03/29/2015 11:17 AM   CSW:    Daryel Geraldodney Jishnu Jenniges, LCSW   03/29/2015 11:17 AM   Other:  03/29/2015 11:17 AM   Other:   03/29/2015 11:17 AM   Other:  Onnie BoerJennifer Clark, Nurse CM 03/29/2015 11:17 AM   Other:  Leisa LenzValerie Enoch, [facility] T[facility] 03/29/2015 11:17 AM   Other:  Tomasita Morrowelora Sutton, P4CC  03/29/2015 11:17 AM   Other:  03/29/2015 11:17 AM   Other:  03/29/2015 11:17 AM   Other:  03/29/2015 11:17 AM   Other:  03/29/2015 11:17 AM   Other:  03/29/2015 11:17 AM   Other:   03/29/2015 11:17 AM    Scribe for Treatment Team:   Ida RogueNorth, Madaleine Simmon B, 03/29/2015 11:17 AM

## 2015-03-29 NOTE — Progress Notes (Signed)
Did not attend group 

## 2015-03-29 NOTE — Progress Notes (Addendum)
Pt has been pacing the hallways this am. He is very pleasant and states the rap, voices are better today. Pt rates his depression 0/10 and his anxiety a 0/20. His goal is to have a good day today. Pt does contract for safety. On occasion  He does smile when he hears the pts on the hall talking. 5p-Pt has been pacing the halls the entire shift. He remains very pleasant and cooperative.

## 2015-03-29 NOTE — Progress Notes (Signed)
Patient in bed at the beginning of this shift. He appeared  And sounded tired. He reported feeling tired. Mood and affect sad and depressed. Although he denied SI/HI and denied Hallucinations. Writer encouraged and supported patient. Q 15 minute check continues as ordered to maintain safety.

## 2015-03-29 NOTE — [facility] Group Notes (Signed)
[facility] LCSW Group Therapy  03/29/2015  1:05 PM  Type of Therapy:  Group therapy  Participation Level:  Invited.  Chose to not attend  Summary of Progress/Problems:  Chaplain was here to lead a group on themes of hope and courage.    Glenn Bolton, Glenn Bolton 03/29/2015 1:32 PM

## 2015-03-30 NOTE — Plan of Care (Signed)
Problem: Alteration in mood Goal: LTG-Patient reports reduction in suicidal thoughts (Patient reports reduction in suicidal thoughts and is able to verbalize a safety plan for whenever patient is feeling suicidal)  Outcome: Progressing Patient denied SI tonight     

## 2015-03-30 NOTE — Progress Notes (Signed)
D:Patient in bed on approach.  Patient states he had a good day.   Patient states he did not need anything at the time.  Patient forwards little information.  Patient denies SI/HI and denies AVH.    A: Staff to monitor Q 15 mins for safety.  Encouragement and support offered.  No scheduled medications administered per orders. R: Patient remains safe on the unit.  Patient did not attend group tonight.  Patient not visible on th unit.  Patient did not have medications to be administered tonight.

## 2015-03-30 NOTE — [facility] Suicide Risk Assessment (Signed)
[facility] INPATIENT:  Family/Significant Other Suicide Prevention Education  Suicide Prevention Education:  Education Completed; No one has been identified by the patient as the family member/significant other with whom the patient will be residing, and identified as the person(s) who will aid the patient in the event of a mental health crisis (suicidal ideations/suicide attempt).  With written consent from the patient, the family member/significant other has been provided the following suicide prevention education, prior to the and/or following the discharge of the patient.  The suicide prevention education provided includes the following:  Suicide risk factors  Suicide prevention and interventions  National Suicide Hotline telephone number  [facility] assessment telephone number  [facility] Emergency Assistance 911  [facility] and/or Residential Mobile Crisis Unit telephone number  Request made of family/significant other to:  Remove weapons (e.g., guns, rifles, knives), all items previously/currently identified as safety concern.    Remove drugs/medications (over-the-counter, prescriptions, illicit drugs), all items previously/currently identified as a safety concern.  The family member/significant other verbalizes understanding of the suicide prevention education information provided.  The family member/significant other agrees to remove the items of safety concern listed above. The patient did not endorse SI at the time of admission, nor did the patient c/o SI during the stay here.  SPE not required. However, did talk to mother, Glenn Bolton, [phone], and talked about a crises plan with her.  [name], Glenn Bolton B 03/30/2015, 3:41 PM

## 2015-03-30 NOTE — [facility] Group Notes (Signed)
[facility] LCSW Group Therapy  03/30/2015 3:22 PM   Type of Therapy:  Group Therapy  Participation Level:  Active  Participation Quality:  Attentive  Affect:  Appropriate  Cognitive:  Appropriate  Insight:  Improving  Engagement in Therapy:  Engaged  Modes of Intervention:  Clarification, Education, Exploration and Socialization  Summary of Progress/Problems: Today's group focused on relapse prevention.  We defined the term, and then brainstormed on ways to prevent relapse.  Glenn NajjarLarry stayed for the entire time.  Quiet.  When talking about supports, identified his mother and siblings.  [name], [name] B 03/30/2015 , 3:22 PM

## 2015-03-30 NOTE — Progress Notes (Addendum)
[facility]  03/30/2015 12:40 PM Glenn CageLarry Bolton  MRN:  [id number] Subjective: " I am OK, I am just tired , I think the voices are going down.'     Objective: Patient seen chart reviewed. Patient has a hx of schizophrenia ,presented with delusions as well as AH of being connected to as well as being able to communicate with rappers.   Pt this AM appears to be cooperative , reports AH as getting better. Pt however appears to have a flat affect , poor eye contact and is guarded. Pt has been tolerating them well. Pt denies any ADRs of medications except for feeling tired . Pt denies SI/HI.  Pt encouraged to participate in group activities. Pt declined LAI Haldol decanoate - wants to stay on the PO.    Per staff pt is withdrawn and isolative , but reported psychosis as improving.Pt is compliant on medications.     Principal Problem: Schizophrenia Diagnosis:   Patient Active Problem List   Diagnosis Date Noted  . Tobacco use disorder [Z72.0] 03/24/2015  . Schizophrenia [F20.9] 03/23/2015   Total Time spent with patient: 30 minutes   Past Medical History:  Past Medical History  Diagnosis Date  . Schizophrenia    History reviewed. No pertinent past surgical history. Family History:  Family History  Problem Relation Age of Onset  . Depression Sister   . Diabetes Other    Social History:  History  Alcohol Use  . Yes    Comment: socially     History  Drug Use  . Yes  . Special: Marijuana    History   Social History  . Marital Status: Single    Spouse Name: N/A  . Number of Children: N/A  . Years of Education: N/A   Social History Main Topics  . Smoking status: Never Smoker   . Smokeless tobacco: Not on file  . Alcohol Use: Yes     Comment: socially  . Drug Use: Yes    Special: Marijuana  . Sexual Activity: No   Other Topics Concern  . None   Social History Narrative   Additional History:    Sleep: Fair  Appetite:   Fair     Musculoskeletal: Strength & Muscle Tone: within normal limits Gait & Station: normal Patient leans: N/A   Psychiatric Specialty Exam: Physical Exam  Review of Systems  Constitutional: Positive for malaise/fatigue.  Psychiatric/Behavioral: Positive for hallucinations.  All other systems reviewed and are negative.   Blood pressure 96/68, pulse 130, temperature 97.4 F (36.3 C), temperature source Oral, resp. rate 16, height 5\' 10"  (1.778 m), weight 73.483 kg (162 lb).Body mass index is 23.24 kg/(m^2).  General Appearance: Guarded  Eye Contact::  Fair  Speech:  Normal Rate  Volume:  Decreased  Mood:  Anxious and Depressed improving  Affect:  Flat  Thought Process:  Disorganized improving  Orientation:  Full (Time, Place, and Person)  Thought Content:  Delusions and Hallucinations: Auditory improving  Suicidal Thoughts:  No  Homicidal Thoughts:  No  Memory:  Immediate;   Fair Recent;   Fair Remote;   Fair  Judgement:  Fair  Insight:  Lacking  Psychomotor Activity:  Normal restless, improving  Concentration:  Poor  Recall:  [employer]  Fund of Knowledge:Fair  Language: Fair  Akathisia:  No  Handed:  Right  AIMS (if indicated):     Assets:  Desire for Improvement Housing Social Support  ADL's:  Intact  Cognition: WNL  Sleep:  Number of Hours: 6.75     Current Medications: Current Facility-Administered Medications  Medication Dose Route Frequency Provider Last Rate Last Dose  . acetaminophen (TYLENOL) tablet 650 mg  650 mg Oral Q6H PRN Worthy Flank, NP      . alum & mag hydroxide-simeth (MAALOX/MYLANTA) 200-200-20 MG/5ML suspension 30 mL  30 mL Oral Q4H PRN Worthy Flank, NP      . benztropine (COGENTIN) tablet 0.5 mg  0.5 mg Oral QPC breakfast Jomarie Longs, MD   0.5 mg at 03/30/15 0842  . benztropine (COGENTIN) tablet 0.5 mg  0.5 mg Oral QPC supper Jomarie Longs, MD   0.5 mg at 03/29/15 1656  . citalopram (CELEXA) tablet 20 mg  20 mg Oral Daily Jomarie Longs, MD   20 mg at 03/30/15 0843  . feeding supplement (RESOURCE BREEZE) (RESOURCE BREEZE) liquid 1 Container  1 Container Oral BID BM Renie Ora, RD   1 Container at 03/27/15 [phone]  . haloperidol (HALDOL) tablet 20 mg  20 mg Oral QPC supper Jomarie Longs, MD   20 mg at 03/29/15 1656  . haloperidol (HALDOL) tablet 5 mg  5 mg Oral QPC breakfast Jomarie Longs, MD   5 mg at 03/30/15 0843  . hydrOXYzine (ATARAX/VISTARIL) tablet 25 mg  25 mg Oral TID PRN Worthy Flank, NP   25 mg at 03/25/15 0841  . magnesium hydroxide (MILK OF MAGNESIA) suspension 30 mL  30 mL Oral Daily PRN Worthy Flank, NP      . nicotine polacrilex (NICORETTE) gum 2 mg  2 mg Oral PRN Worthy Flank, NP   2 mg at 03/29/15 1508  . OLANZapine zydis (ZYPREXA) disintegrating tablet 10 mg  10 mg Oral Q8H PRN Jomarie Longs, MD      . traZODone (DESYREL) tablet 50 mg  50 mg Oral QHS PRN Worthy Flank, NP   50 mg at 03/29/15 2139    Lab Results:  No results found for this or any previous visit (from the past 48 hour(s)).  Physical Findings: AIMS: Facial and Oral Movements Muscles of Facial Expression: None, normal Lips and Perioral Area: None, normal Jaw: None, normal Tongue: None, normal,Extremity Movements Upper (arms, wrists, hands, fingers): None, normal Lower (legs, knees, ankles, toes): None, normal, Trunk Movements Neck, shoulders, hips: None, normal, Overall Severity Severity of abnormal movements (highest score from questions above): None, normal Incapacitation due to abnormal movements: None, normal Patient's awareness of abnormal movements (rate only patient's report): No Awareness, Dental Status Current problems with teeth and/or dentures?: No Does patient usually wear dentures?: No  CIWA:    COWS:     Assessment: Patient is a 24 year old AAM who presented with schizophrenia , presented as very delusional as well as with AH of rappers. Pt continues to be psychotic , with some improvement . Will  continue treatment.  Treatment Plan Summary: Medication management   Increased Haldol to 25 mg po daily in two divided doses for psychosis. Discussed LAI with patient - Haldol decanoate - pt declined.AIMS - 0 (03/30/15) Will continue Cogentin 0.5 mg po bid for EPS. Increased Celexa to 20 mg po daily for depressive sx. Will continue Trazodone 50 mg po qhs for sleep. Continue Haldol 5 mg po/IM prn ,Benadryl 25 mg PO/IM prn, ativan 0.5 mg po/im prn for agitation/anxiety sx. Continue Nicotine 2 mg gum prn for smoking cessation. Pt encouraged to attend groups. CSW will work on disposition.    Medical Decision Making:  Review of Psycho-Social Stressors (1), Review of Last Therapy Session (1), Review of Medication Regimen & Side Effects (2) and Review of New Medication or Change in Dosage (2)     Kobyn Kray MD 03/30/2015, 12:40 PM

## 2015-03-30 NOTE — Progress Notes (Deleted)
D: Pt presents with flat affect, minimal interaction. Pt isolates in his room and did not attend morning group. Pt denies suicidal thoughts. Denies homicidal thoughts. No AVH or paranoia verbalized by pt. Pt compliant with taking meds. No adverse reaction to meds verbalized by pt.  Pt do not appear irritable this morning and noted to be less anxious. Pt compliant with staying on the unit for meals and during rec therapy off the unit. Pt hygiene appropriate for situation.  A: Medications administered as ordered per MD. Verbal support given. Pt encouraged to attend groups. 15 minute checks performed for safety. Pt restricted to the unit for safety.  R: Pt stated goal, "Have a good day and stay humble".

## 2015-03-30 NOTE — Progress Notes (Signed)
Adult Psychoeducational Group Note  Date:  03/30/2015 Time:  0930  Group Topic/Focus:  Orientation:   The focus of this group is to educate the patient on the purpose and policies of crisis stabilization and provide a format to answer questions about their admission.  The group details unit policies and expectations of patients while admitted.  Participation Level:  Did not attend   Participation Quality:    Affect:    Cognitive:    Insight:   Engagement in Group:    Modes of Intervention:    Additional Comments:    Aleksei Goodlin L 03/30/2015, 1:25 PM

## 2015-03-30 NOTE — Progress Notes (Signed)
  [facility] :  Will you be returning to the same living situation after discharge:  Yes,  home At discharge, do you have transportation home?: Yes,  mother Do you have the ability to pay for your medications: Yes,  MCD  Release of information consent forms completed and in the chart;  Patient's signature needed at discharge.  Patient to Follow up at: Follow-up Information    Follow up with [facility].   Why:  Go to the walk-in clinic M-F between 8 and 11 AM for your hospital follow up appointment.  You should be prepared to be there for several hours.  After that, you will have appointment times. When you get Medicaid, ask about additional services they have   Contact information:   [address]  [place]  [phone]      Patient denies SI/HI: Yes,  yes    Safety Planning and Suicide Prevention discussed: Yes,  yes  Have you used any form of tobacco in the last 30 days? (Cigarettes, Smokeless Tobacco, Cigars, and/or Pipes): Yes  Has patient been referred to the Quitline?: Patient refused referral  Ida Rogueorth, Mora Pedraza B 03/30/2015, 3:37 PM

## 2015-03-30 NOTE — Progress Notes (Signed)
D: Pt presents with flat affect. Pt denies suicidal thoughts. No AVH verbalized by pt. Pt remains calm and cooperative today. Pt continues to isolate in his room and did not attend morning group. Pt compliant with taking meds and no adverse reaction to meds verbalized by pt. Pt hygiene appropriate for situation.  A: Medications administered as ordered per MD. Verbal support given. Pt encouraged to attend groups. 15 minute checks performed for safety.  R: Pt safety maintained.

## 2015-03-30 NOTE — Progress Notes (Signed)
Patient did not attend the evening karaoke group.  Pt remained in bed asleep during group time.

## 2015-03-31 ENCOUNTER — Encounter (HOSPITAL_COMMUNITY): Payer: Self-pay | Admitting: Registered Nurse

## 2015-03-31 DIAGNOSIS — F201 Disorganized schizophrenia: Secondary | ICD-10-CM | POA: Insufficient documentation

## 2015-03-31 MED ORDER — BENZTROPINE MESYLATE 0.5 MG PO TABS: ORAL_TABLET | ORAL | Status: AC

## 2015-03-31 MED ORDER — BENZTROPINE MESYLATE 0.5 MG PO TABS
0.5000 mg | ORAL_TABLET | Freq: Two times a day (BID) | ORAL | Status: DC
  Filled 2015-03-31 (×2): qty 1

## 2015-03-31 MED ORDER — HYDROXYZINE HCL 25 MG PO TABS: 25.0000 mg | ORAL_TABLET | Freq: Three times a day (TID) | ORAL | Status: AC | PRN

## 2015-03-31 MED ORDER — TRAZODONE HCL 50 MG PO TABS: 50.0000 mg | ORAL_TABLET | Freq: Every evening | ORAL | Status: AC | PRN

## 2015-03-31 MED ORDER — HALOPERIDOL 5 MG PO TABS: ORAL_TABLET | ORAL | Status: AC

## 2015-03-31 MED ORDER — CITALOPRAM HYDROBROMIDE 20 MG PO TABS: 20.0000 mg | ORAL_TABLET | Freq: Every day | ORAL | Status: AC

## 2015-03-31 NOTE — Progress Notes (Signed)
Discharge note: Pt received both written and verbal discharge instructions. Pt verbalized understanding of discharge instructions. Pt agreed to f/u appt and med regimen. Pt denies suicidal thoughts at this time. Pt received belongings from room and locker. [facility]

## 2015-03-31 NOTE — Discharge Summary (Signed)
Physician Discharge Summary Note  Patient:  Glenn Bolton is an 24 y.o., male MRN:  [id number] DOB:  [birth year] Patient phone:  [phone] (home)  Patient address:   [address], [address] [place] [id number],  Total Time spent with patient: Greater than 30 minutes  Date of Admission:  03/23/2015 Date of Discharge: 03/31/2015  Reason for Admission:  Per H&P Admission: Glenn Bolton is a [age] y old AAM ,who is single , unemployed , has applied for disability (states it is approved and that he will get his first check next week), [place], has a past hx of schizophrenia ( diagnosed 3 yrs ago) , [facility] , reporting AH.   Patient seen today. Pt seen as delusional , talks about rappers talking to him directly , states he does not know how to explain it, but they are not mere voices , but they talk to him through him. Pt has been signed to record labels with these rappers , he has been talking to [employer]ki Minaj, [employer]once as well as others. Pt states that they are getting closer and closer and would not stop talking , and it has become frustrating to him. He denies sleep issues , appetite changes , feeling tired . He does report feeling depressed about his situation. Pt denies SI/HI. Pt denies paranoia. Pt denies mood swings.  Pt reports being diagnosed with schizophrenia 3 yrs ago. Pt was hospitalized in [place] x2-3 . Pt relocated to [place] <1 YR ago to live with his mother ,since he was having relational struggles with his uncle in [place]. Pt reports having two hospitalizations after being in [place]. He [facility] at [facility] ( 08/2014 - per EHR), [facility] ( recently). Pt reports following up with [facility], and being on Invega sustenna , but reports Invega as not effective. Pt this time does not want to be discharged on an LAI ,would take the oral pill.   Principal Problem: Schizophrenia Discharge Diagnoses: Patient Active Problem List   Diagnosis Date Noted  . 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 Tobacco use disorder [Z72.0] 03/24/2015  . Schizophrenia [F20.9] 03/23/2015    Musculoskeletal: Strength & Muscle Tone: within normal limits Gait & Station: normal Patient leans: N/A  Psychiatric Specialty Exam:  See Suicide Risk Assessment Physical Exam  Nursing note and vitals reviewed. Constitutional: He is oriented to person, place, and time.  Neck: Normal range of motion.  Respiratory: Effort normal.  Musculoskeletal: Normal range of motion.  Neurological: He is alert and oriented to person, place, and time.    Review of Systems  Psychiatric/Behavioral: Negative for suicidal ideas and hallucinations. Depression: Stable. Nervous/anxious: Stable. Insomnia: Stable.   All other systems reviewed and are negative.   Blood pressure 96/57, pulse 108, temperature 97.8 F (36.6 C), temperature source Oral, resp. rate 16, height 5\' 10"  (1.778 m), weight 73.483 kg (162 lb).Body mass index is 23.24 kg/(m^2).  Have you used any form of tobacco in the last 30 days? (Cigarettes, Smokeless Tobacco, Cigars, and/or Pipes): Yes  Has this patient used any form of tobacco in the last 30 days? (Cigarettes, Smokeless Tobacco, Cigars, and/or Pipes) Yes, A prescription for an FDA-approved tobacco cessation medication was offered at discharge and the patient refused  Past Medical History:  Past Medical History  Diagnosis Date  . Schizophrenia    History reviewed. No pertinent past surgical history. Family History:  Family History  Problem Relation Age of Onset  . Depression Sister   .  Diabetes Other    Social History:  History  Alcohol Use  . Yes    Comment: socially     History  Drug Use  . Yes  . Special: Marijuana    History   Social History  . Marital Status: Single    Spouse Name: N/A  . Number of Children: N/A  . Years of Education: N/A   Social History Main Topics  . Smoking status: Never Smoker   . Smokeless tobacco: Not on file  . Alcohol Use: Yes      Comment: socially  . Drug Use: Yes    Special: Marijuana  . Sexual Activity: No   Other Topics Concern  . None   Social History Narrative   Risk to Self: Is patient at risk for suicide?: No What has been your use of drugs/alcohol within the last 12 months?: Denies Risk to Others:   Prior Inpatient Therapy:   Prior Outpatient Therapy:    Level of Care:  OP  Hospital Course:  Glenn Bolton was admitted for Schizophrenia and crisis management.  He was treated discharged with the medications listed below under Medication List.  Medical problems were identified and treated as needed.  Home medications were restarted as appropriate.  Improvement was monitored by observation and Glenn Cage daily report of symptom reduction.  Emotional and mental status was monitored by daily self-inventory reports completed by Glenn Cage and clinical staff.         Glenn Bolton was evaluated by the treatment team for stability and plans for continued recovery upon discharge.  Glenn Bolton motivation was an integral factor for scheduling further treatment.  Employment, transportation, bed availability, health status, family support, and any pending legal issues were also considered during his hospital stay.  He was offered further treatment options upon discharge including but not limited to Residential, Intensive Outpatient, and Outpatient treatment.  Glenn Bolton will follow up with the services as listed below under Follow Up Information.     Upon completion of this admission the patient was both mentally and medically stable for discharge denying suicidal/homicidal ideation, auditory/visual/tactile hallucinations, delusional thoughts and paranoia.      Consults:  psychiatry  Significant Diagnostic Studies:  labs: THS, Lipid panel, HgbA1c, ETOH, UDS, CMET, CBC, I-stat troponin   Discharge Vitals:   Blood pressure 96/57, pulse 108, temperature 97.8 F (36.6 C), temperature source Oral, resp. rate 16,  height  (1.778 m), weight 73.483 kg (162 lb). Body mass index is 23.24 kg/(m^2). Lab Results:   No results found for this or any previous visit (from the past 72 hour(s)).  Physical Findings: AIMS: Facial and Oral Movements Muscles of Facial Expression: None, normal Lips and Perioral Area: None, normal Jaw: None, normal Tongue: None, normal,Extremity Movements Upper (arms, wrists, hands, fingers): None, normal Lower (legs, knees, ankles, toes): None, normal, Trunk Movements Neck, shoulders, hips: None, normal, Overall Severity Severity of abnormal movements (highest score from questions above): None, normal Incapacitation due to abnormal movements: None, normal Patient's awareness of abnormal movements (rate only patient's report): No Awareness, Dental Status Current problems with teeth and/or dentures?: No Does patient usually wear dentures?: No  CIWA:    COWS:      See Psychiatric Specialty Exam and Suicide Risk Assessment completed by Attending Physician prior to discharge.  Discharge destination:  Home  Is patient on multiple antipsychotic therapies at discharge:  Yes,   Do you recommend tapering to monotherapy for antipsychotics?  No   Has  Patient had three or more failed trials of antipsychotic monotherapy by history:  No    Recommended Plan for Multiple Antipsychotic Therapies: Additional reason(s) for multiple antispychotic treatment:  For patient stabilization      Discharge Instructions    Activity as tolerated - No restrictions    Complete by:  As directed      Diet general    Complete by:  As directed      Discharge instructions    Complete by:  As directed   Take all of you medications as prescribed by your mental healthcare provider.  Report any adverse effects and reactions from your medications to your outpatient provider promptly. Do not engage in alcohol and or illegal drug use while on prescription medicines. In the event of worsening symptoms  call the crisis hotline, 911, and or go to the nearest emergency department for appropriate evaluation and treatment of symptoms. Follow-up with your primary care provider for your medical issues, concerns and or health care needs.   Keep all scheduled appointments.  If you are unable to keep an appointment call to reschedule.  Let the nurse know if you will need medications before next scheduled appointment.            Medication List    STOP taking these medications        INVEGA PO      TAKE these medications      Indication   benztropine 0.5 MG tablet  Commonly known as:  COGENTIN  Take 0.5 mg (one tablet) twice daily. Once with breakfast and Once with dinner For drug induced extrapyramidal reaction   Indication:  Extrapyramidal Reaction caused by Medications     citalopram 20 MG tablet  Commonly known as:  CELEXA  Take 1 tablet (20 mg total) by mouth daily. For depression   Indication:  Depression     haloperidol 5 MG tablet  Commonly known as:  HALDOL  Take 0.5 mg (one tablet) after breakfast and Take 20 mg (4 tablets) after supper.  For schizophrenia   Indication:  Schizophrenia     hydrOXYzine 25 MG tablet  Commonly known as:  ATARAX/VISTARIL  Take 1 tablet (25 mg total) by mouth 3 (three) times daily as needed for anxiety.   Indication:  anxiety     traZODone 50 MG tablet  Commonly known as:  DESYREL  Take 1 tablet (50 mg total) by mouth at bedtime as needed for sleep (May repeat X1).   Indication:  Trouble Sleeping       Follow-up Information    Follow up with [facility].   Why:  Go to the walk-in clinic M-F between 8 and 11 AM for your hospital follow up appointment.  You have an appointment with the Dr on Aug 4, but I could not get you a sooner appointment, and you will need to see someone next week   Contact information:   [address]  [place]  [336] [phone]      Follow-up recommendations:  Activity:  As tolerated Diet:  As tolerated  Comments:    Patient has been instructed to take medications as prescribed; and report adverse effects to outpatient provider.  Follow up with primary doctor for any medical issues and If symptoms recur report to nearest emergency or crisis hot line.    Total Discharge Time: Greater than 30 minutes  Signed: Assunta Found, FNP-BC 03/31/2015, 1:53 PM

## 2015-03-31 NOTE — [facility] Suicide Risk Assessment (Signed)
[facility]   Demographic Factors:  Male, Low socioeconomic status and Unemployed  Total Time spent with patient: 30 minutes  Musculoskeletal: Strength & Muscle Tone: within normal limits Gait & Station: normal Patient leans: N/A  Psychiatric Specialty Exam: Physical Exam  Review of Systems  Psychiatric/Behavioral: Positive for hallucinations (pt with chronic bizarre delusion that rappers talks through him, pt reports that these thoughts are better, however reports that it never goes away.).  All other systems reviewed and are negative.   Blood pressure 96/57, pulse 108, temperature 97.8 F (36.6 C), temperature source Oral, resp. rate 16, height 5\' 10"  (1.778 m), weight 73.483 kg (162 lb).Body mass index is 23.24 kg/(m^2).  General Appearance: Casual  Eye Contact::  Fair  Speech:  Clear and Coherent409  Volume:  Normal  Mood:  Euthymic  Affect:  Congruent  Thought Process:  Coherent  Orientation:  Full (Time, Place, and Person)  Thought Content:  Delusions  Suicidal Thoughts:  No  Homicidal Thoughts:  No  Memory:  Immediate;   Fair Recent;   Fair Remote;   Fair  Judgement:  Fair  Insight:  Shallow  Psychomotor Activity:  Normal  Concentration:  Fair  Recall:  [employer]  Fund of Knowledge:Fair  Language: Fair  Akathisia:  No  Handed:  Right  AIMS (if indicated):     Assets:  Communication Skills Desire for Improvement  Sleep:  Number of Hours: 6.75  Cognition: WNL  ADL's:  Intact   Have you used any form of tobacco in the last 30 days? (Cigarettes, Smokeless Tobacco, Cigars, and/or Pipes): Yes  Has this patient used any form of tobacco in the last 30 days? (Cigarettes, Smokeless Tobacco, Cigars, and/or Pipes) Yes, Prescription  provided for nicotine gum.  Mental Status Per Nursing Assessment::   On Admission:  NA  Current Mental Status by Physician: Patient presented with bizarre delusions that rappers talks through him. Pt reported that  he could hear them talk through him and that they were not real voices, and that it was hard to explain. Pt reported that these delsuions never went away. During the course of his stay , these delsuions improved, he was not bothered by them , felt they were better on the day of discharge. Pt denied SI/HI.VH on the day of discharge.  Loss Factors: Financial problems/change in socioeconomic status  Historical Factors: Impulsivity  Risk Reduction Factors:   Living with another person, especially a relative and Positive social support  Continued Clinical Symptoms:  Previous Psychiatric Diagnoses and Treatments  Cognitive Features That Contribute To Risk:  Closed-mindedness    Suicide Risk:  Minimal: No identifiable suicidal ideation.  Patients presenting with no risk factors but with morbid ruminations; may be classified as minimal risk based on the severity of the depressive symptoms  Principal Problem: Schizophrenia, multiple episodes , currently in acute episode -( resolved -acute phase) Discharge Diagnoses:  Patient Active Problem List   Diagnosis Date Noted  . Tobacco use disorder [Z72.0] 03/24/2015  . Schizophrenia [F20.9] 03/23/2015    Follow-up Information    Follow up with [facility].   Why:  Go to the walk-in clinic M-F between 8 and 11 AM for your hospital follow up appointment.  You should be prepared to be there for several hours.  After that, you will have appointment times. When you get Medicaid, ask about additional services they have   Contact information:   [address]  [place]  [336] [phone]  Plan Of Care/Follow-up recommendations:  Activity:  no restrictions Diet:  regular Tests:  as needed Other:  follow up with after care as needed  Is patient on multiple antipsychotic therapies at discharge:  No   Has Patient had three or more failed trials of antipsychotic monotherapy by history:  No  Recommended Plan for Multiple Antipsychotic  Therapies: NA    Joanne Salah md 03/31/2015, 9:43 AM

## 2016-01-01 IMAGING — CT CT HEAD W/O CM
2 series · 16 of 30 positions shown, 20 images · non-contrast
Comparison: None.

CLINICAL DATA: Altered mental status.

EXAM:
CT HEAD WITHOUT CONTRAST
TECHNIQUE: Contiguous axial images were obtained from the base of the skull
through the vertex without intravenous contrast.

[Series 2: head w/o · axial · non-contrast · 0.45mm/px · z∈[-192,-47]mm · 13 of 35 slices shown, 17 images]
[im 3/35  brain]
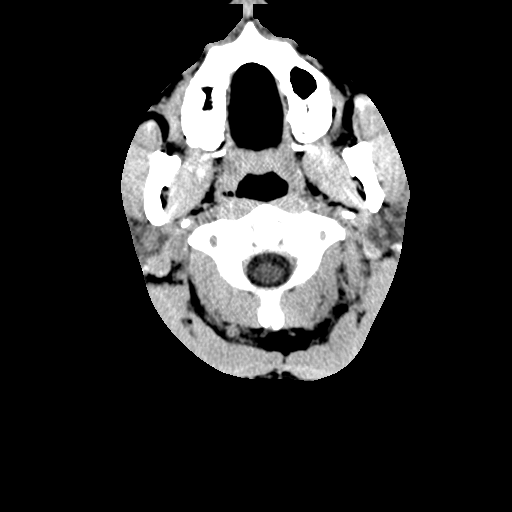
[im 3/35  bone]
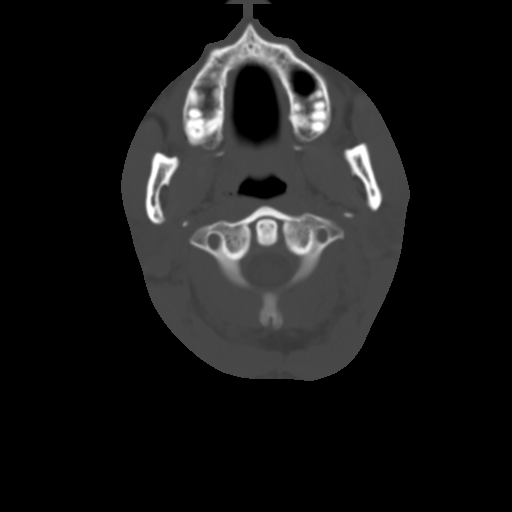
[im 5/35  brain]
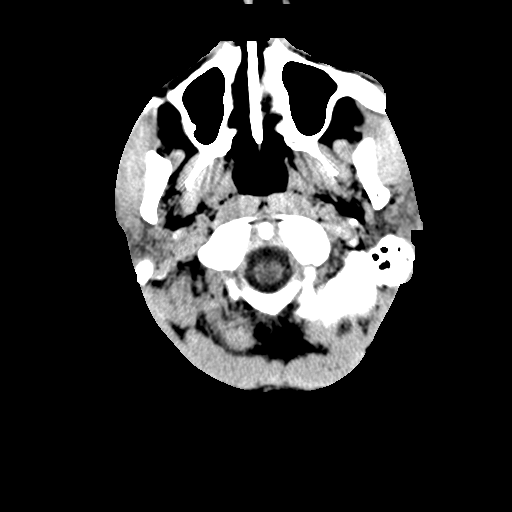
[im 8/35  brain]
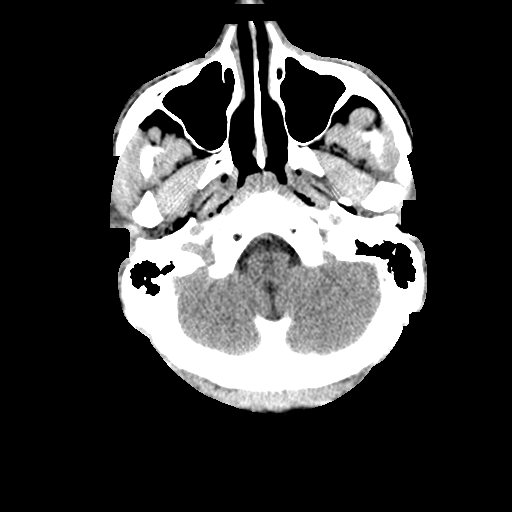
[im 10/35  brain]
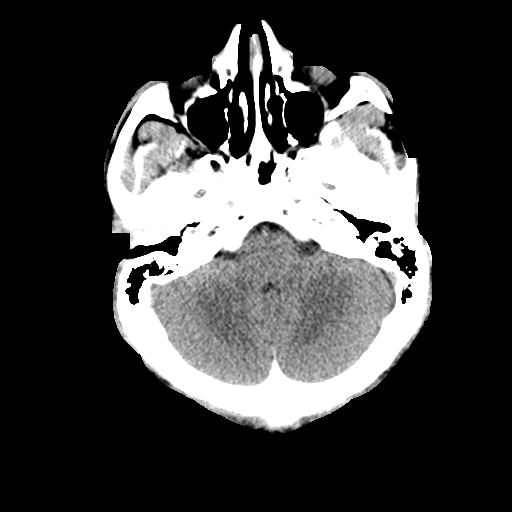
[im 13/35  brain]
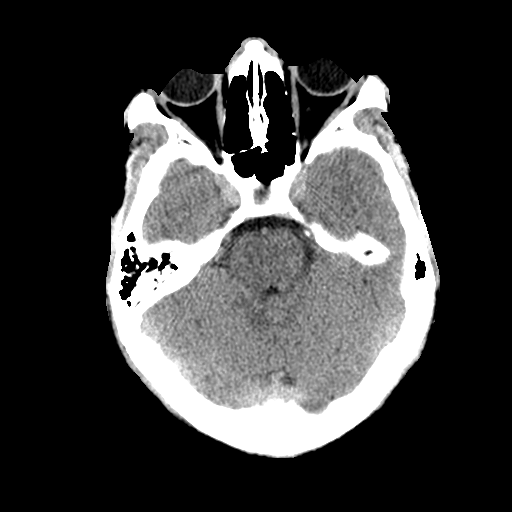
[im 13/35  bone]
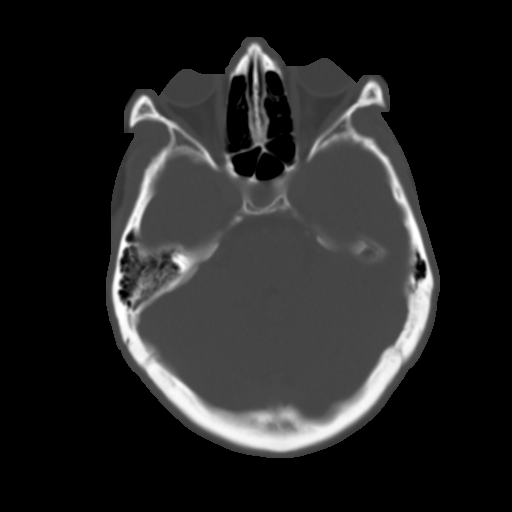
[im 15/35  brain]
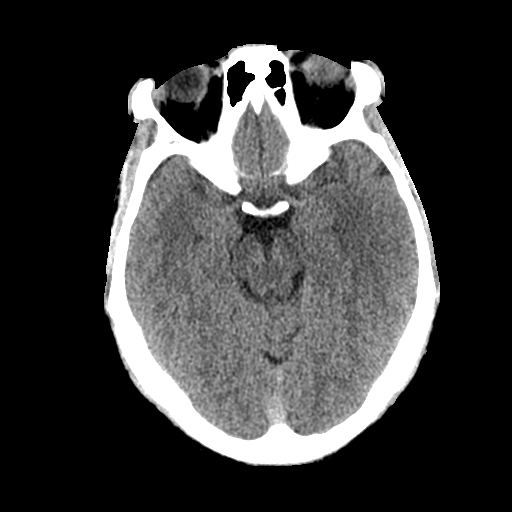
[im 18/35  brain]
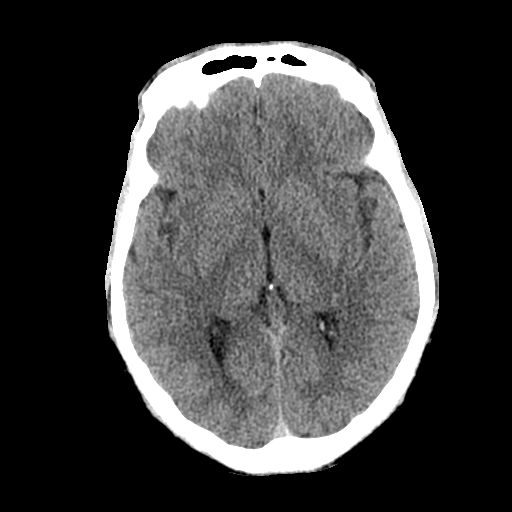
[im 20/35  brain]
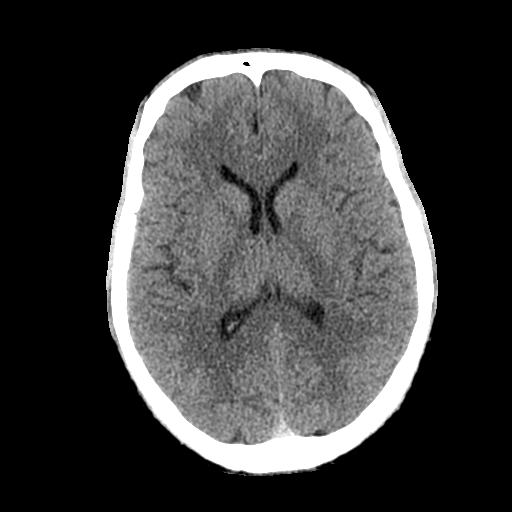
[im 22/35  brain]
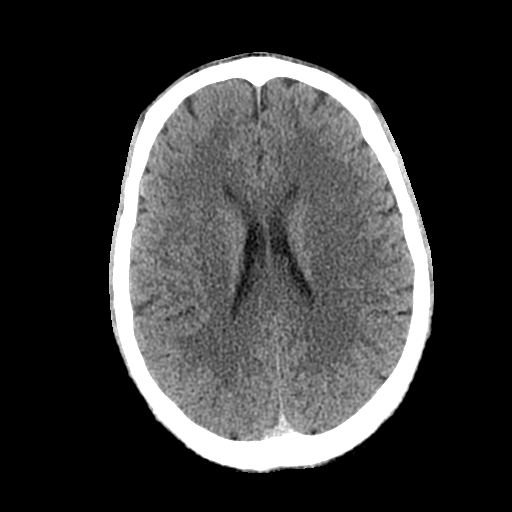
[im 22/35  bone]
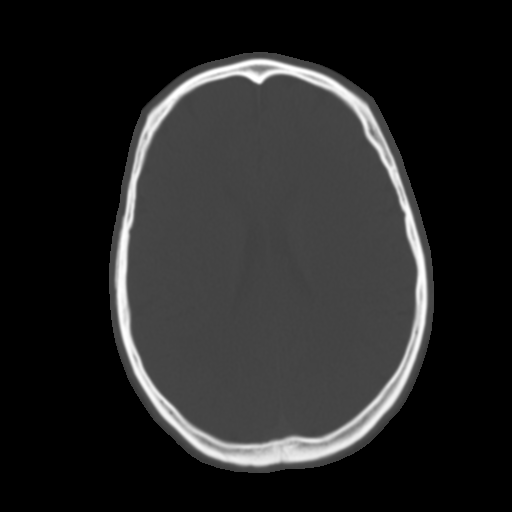
[im 25/35  brain]
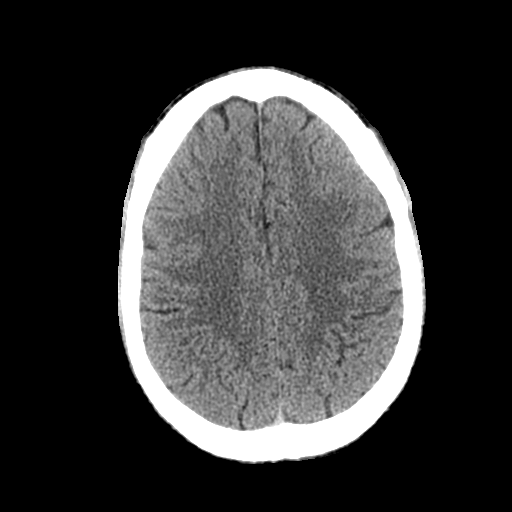
[im 27/35  brain]
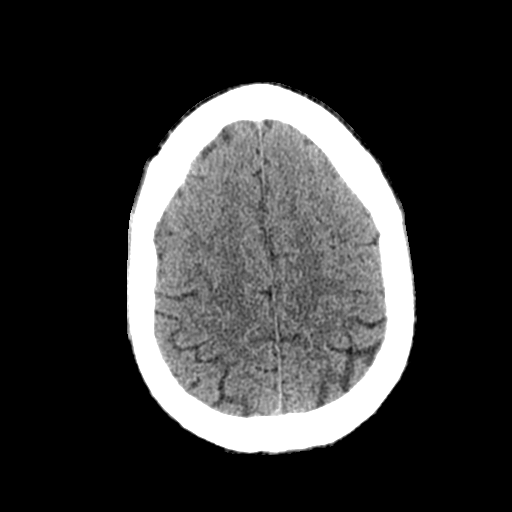
[im 30/35  brain]
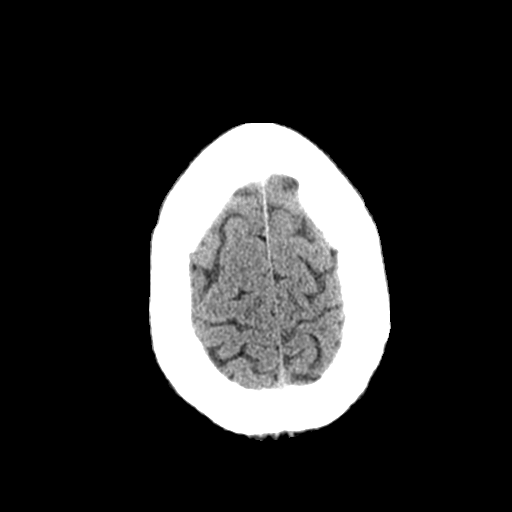
[im 32/35  brain]
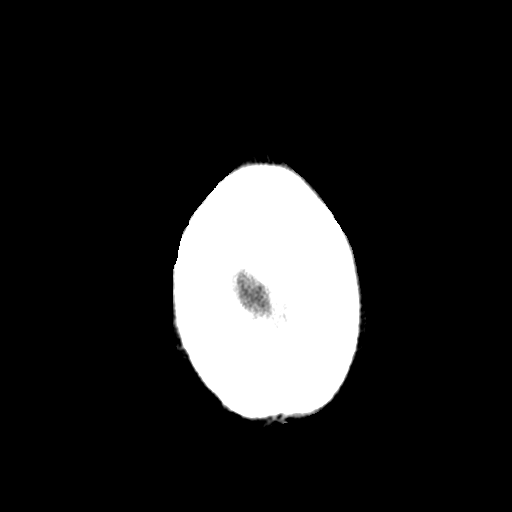
[im 32/35  bone]
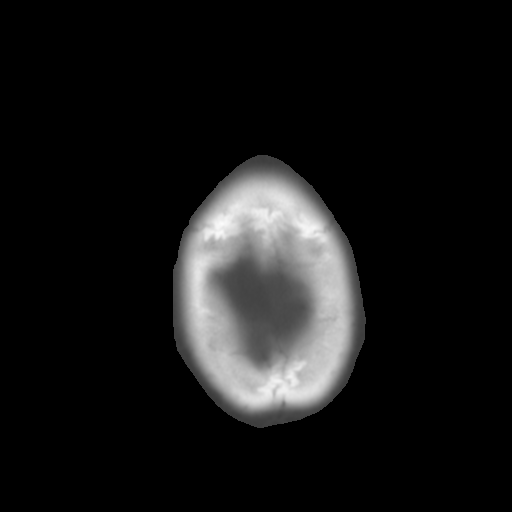

[Series 3: bone windows · axial · 0.45mm/px · z∈[-192,-142]mm · 3 of 35 slices shown]
[im 3/35  bone]
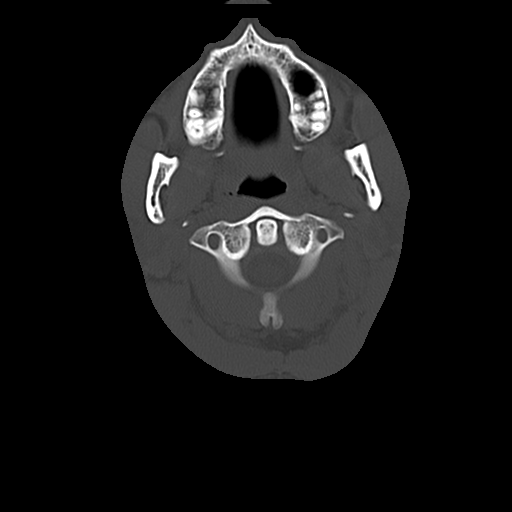
[im 8/35  bone]
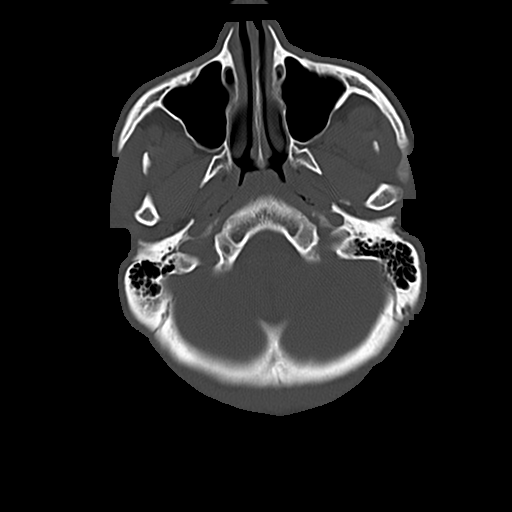
[im 13/35  bone]
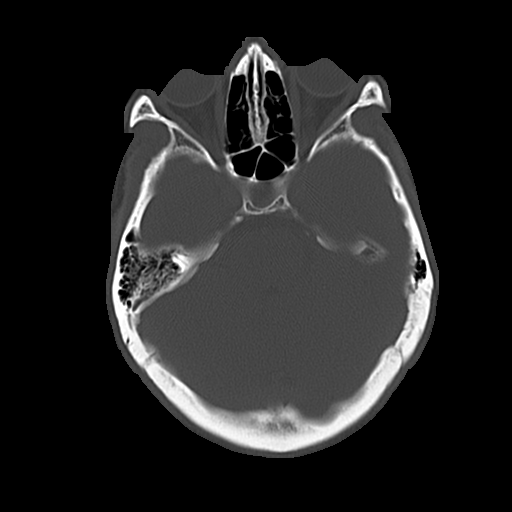

[16 of 30 positions shown; findings below may reference images not displayed]

FINDINGS: No intra-axial or extra-axial pathologic fluid or blood collection.
No mass. No hydrocephalus. Scratch No acute bony abnormality
identified.
IMPRESSION: No acute abnormality.
